# Patient Record
Sex: Female | Born: 1980 | Race: White | Hispanic: Yes | Marital: Single | Smoking: Never smoker
Health system: Southern US, Community
[De-identification: ages and names within clinical notes are randomized; demographics above are authoritative.]

## PROBLEM LIST (undated history)

## (undated) DIAGNOSIS — N939 Abnormal uterine and vaginal bleeding, unspecified: Secondary | ICD-10-CM

## (undated) DIAGNOSIS — I8390 Asymptomatic varicose veins of unspecified lower extremity: Secondary | ICD-10-CM

## (undated) DIAGNOSIS — M722 Plantar fascial fibromatosis: Secondary | ICD-10-CM

## (undated) DIAGNOSIS — E781 Pure hyperglyceridemia: Secondary | ICD-10-CM

## (undated) DIAGNOSIS — G43909 Migraine, unspecified, not intractable, without status migrainosus: Secondary | ICD-10-CM

## (undated) DIAGNOSIS — R87619 Unspecified abnormal cytological findings in specimens from cervix uteri: Secondary | ICD-10-CM

## (undated) DIAGNOSIS — M222X9 Patellofemoral disorders, unspecified knee: Secondary | ICD-10-CM

## (undated) DIAGNOSIS — D509 Iron deficiency anemia, unspecified: Secondary | ICD-10-CM

## (undated) DIAGNOSIS — E669 Obesity, unspecified: Secondary | ICD-10-CM

## (undated) DIAGNOSIS — M25569 Pain in unspecified knee: Secondary | ICD-10-CM

## (undated) HISTORY — DX: Unspecified abnormal cytological findings in specimens from cervix uteri: R87.619

## (undated) HISTORY — DX: Pain in unspecified knee: M25.569

## (undated) HISTORY — DX: Plantar fascial fibromatosis: M72.2

## (undated) HISTORY — DX: Migraine, unspecified, not intractable, without status migrainosus: G43.909

## (undated) HISTORY — DX: Obesity, unspecified: E66.9

## (undated) HISTORY — DX: Patellofemoral disorders, unspecified knee: M22.2X9

## (undated) HISTORY — DX: Pure hyperglyceridemia: E78.1

## (undated) HISTORY — DX: Iron deficiency anemia, unspecified: D50.9

## (undated) HISTORY — DX: Abnormal uterine and vaginal bleeding, unspecified: N93.9

## (undated) HISTORY — DX: Asymptomatic varicose veins of unspecified lower extremity: I83.90

---

## 2011-12-24 ENCOUNTER — Other Ambulatory Visit: Payer: Self-pay | Admitting: Geriatric Medicine

## 2011-12-24 DIAGNOSIS — R198 Other specified symptoms and signs involving the digestive system and abdomen: Secondary | ICD-10-CM

## 2011-12-24 DIAGNOSIS — R1011 Right upper quadrant pain: Secondary | ICD-10-CM

## 2011-12-29 ENCOUNTER — Other Ambulatory Visit: Payer: Self-pay

## 2013-11-16 HISTORY — PX: CHOLECYSTECTOMY: SHX55

## 2014-05-26 ENCOUNTER — Ambulatory Visit: Payer: Self-pay | Attending: Internal Medicine

## 2014-06-22 ENCOUNTER — Ambulatory Visit: Payer: Self-pay | Admitting: Internal Medicine

## 2014-07-25 ENCOUNTER — Ambulatory Visit: Payer: No Typology Code available for payment source | Attending: Family Medicine | Admitting: Family Medicine

## 2014-07-25 ENCOUNTER — Encounter: Payer: Self-pay | Admitting: Family Medicine

## 2014-07-25 VITALS — BP 90/63 | HR 88 | Temp 98.6°F | Resp 16 | Ht 60.5 in | Wt 188.0 lb

## 2014-07-25 DIAGNOSIS — K219 Gastro-esophageal reflux disease without esophagitis: Secondary | ICD-10-CM | POA: Insufficient documentation

## 2014-07-25 DIAGNOSIS — M549 Dorsalgia, unspecified: Secondary | ICD-10-CM

## 2014-07-25 DIAGNOSIS — R51 Headache: Secondary | ICD-10-CM | POA: Insufficient documentation

## 2014-07-25 DIAGNOSIS — Z9049 Acquired absence of other specified parts of digestive tract: Secondary | ICD-10-CM | POA: Insufficient documentation

## 2014-07-25 DIAGNOSIS — M5489 Other dorsalgia: Secondary | ICD-10-CM | POA: Insufficient documentation

## 2014-07-25 DIAGNOSIS — K2100 Gastro-esophageal reflux disease with esophagitis, without bleeding: Secondary | ICD-10-CM

## 2014-07-25 DIAGNOSIS — R739 Hyperglycemia, unspecified: Secondary | ICD-10-CM

## 2014-07-25 DIAGNOSIS — H538 Other visual disturbances: Secondary | ICD-10-CM | POA: Insufficient documentation

## 2014-07-25 DIAGNOSIS — Z833 Family history of diabetes mellitus: Secondary | ICD-10-CM | POA: Insufficient documentation

## 2014-07-25 DIAGNOSIS — R631 Polydipsia: Secondary | ICD-10-CM | POA: Insufficient documentation

## 2014-07-25 DIAGNOSIS — R350 Frequency of micturition: Secondary | ICD-10-CM | POA: Insufficient documentation

## 2014-07-25 DIAGNOSIS — M546 Pain in thoracic spine: Secondary | ICD-10-CM

## 2014-07-25 LAB — COMPLETE METABOLIC PANEL WITH GFR
ALT: 25 U/L (ref 0–35)
AST: 22 U/L (ref 0–37)
Albumin: 4 g/dL (ref 3.5–5.2)
Alkaline Phosphatase: 126 U/L — ABNORMAL HIGH (ref 39–117)
BILIRUBIN TOTAL: 0.2 mg/dL (ref 0.2–1.2)
BUN: 11 mg/dL (ref 6–23)
CHLORIDE: 102 meq/L (ref 96–112)
CO2: 27 mEq/L (ref 19–32)
CREATININE: 0.6 mg/dL (ref 0.50–1.10)
Calcium: 9.1 mg/dL (ref 8.4–10.5)
GFR, Est African American: >89 mL/min
GFR, Est Non African American: >89 mL/min
Glucose, Bld: 86 mg/dL (ref 70–99)
Potassium: 4.6 mEq/L (ref 3.5–5.3)
SODIUM: 137 meq/L (ref 135–145)
TOTAL PROTEIN: 7.7 g/dL (ref 6.0–8.3)

## 2014-07-25 LAB — POCT URINALYSIS DIPSTICK
BILIRUBIN UA: NEGATIVE
GLUCOSE UA: NEGATIVE
Ketones, UA: NEGATIVE
LEUKOCYTES UA: NEGATIVE
NITRITE UA: NEGATIVE
Protein, UA: NEGATIVE
Spec Grav, UA: 1.02
UROBILINOGEN UA: 0.2
pH, UA: 5.5

## 2014-07-25 LAB — CBC
HCT: 34.9 % — ABNORMAL LOW (ref 36.0–46.0)
HEMOGLOBIN: 11.5 g/dL — AB (ref 12.0–15.0)
MCH: 27.1 pg (ref 26.0–34.0)
MCHC: 33 g/dL (ref 30.0–36.0)
MCV: 82.1 fL (ref 78.0–100.0)
MPV: 10 fL (ref 9.4–12.4)
PLATELETS: 466 10*3/uL — AB (ref 150–400)
RBC: 4.25 MIL/uL (ref 3.87–5.11)
RDW: 14.6 % (ref 11.5–15.5)
WBC: 8.1 10*3/uL (ref 4.0–10.5)

## 2014-07-25 LAB — GLUCOSE, POCT (MANUAL RESULT ENTRY): POC GLUCOSE: 92 mg/dL (ref 70–99)

## 2014-07-25 LAB — POCT GLYCOSYLATED HEMOGLOBIN (HGB A1C): Hemoglobin A1C: 5.6

## 2014-07-25 MED ORDER — PANTOPRAZOLE SODIUM 40 MG PO TBEC: 40.0000 mg | DELAYED_RELEASE_TABLET | Freq: Every day | ORAL | Status: DC

## 2014-07-25 NOTE — Assessment & Plan Note (Addendum)
A: suspect MSK pain due to weakness and lack of physical activity P: CXR Tylenol prn Exercise Stretches Stress relief

## 2014-07-25 NOTE — Assessment & Plan Note (Signed)
A; GERD with esophagitis suspected P: CBC protonix  GERD diet  GI referral for EGD if symptoms do not respond to the above measures

## 2014-07-25 NOTE — Assessment & Plan Note (Signed)
Patient does not have diabetes Plan for low carb diet, 10 % weight loss, regular exercise to avoid diabetes

## 2014-07-25 NOTE — Progress Notes (Signed)
   Subjective:    Patient ID: Audrey SellWendy Epstein, female    DOB: October 22, 1980, 33 y.o.   MRN: [id number] CC: ? Diabetes, epigastric pain and upper back pain HPI 33 yo hispanic female History obtained via interpreter  1. ? Diabetes: gets blurry vision, HA, thirst, frequent urination. Concerned about diabetes given family history of diabetes. Reports history of prediabetes. No history of gestational diabetes. No exercise. Has a poor diet.   2. R upper Back pains:  X 6 months. Comes and goes. Achy and sharp. No SOB. associated with R upper chest pains.   3. R chest pains: come and go. Associated with GERD symptoms of bitter tasted in mouth, heartburn, coughing up blood streaked sputum in AM. No weight loss.    Soc hx: chronic non smoker Surg Hx: sp cholecystectomy 11/2013  Fam Hx: DM2 in mother and father  Review of Systems As per HPI    Objective:   Physical Exam BP 90/63 mmHg  Pulse 88  Temp(Src) 98.6 F (37 C)  Resp 16  Ht 5' 0.5" (1.537 m)  Wt 188 lb (85.276 kg)  BMI 36.10 kg/m2  SpO2 99%  LMP 07/12/2014 General appearance: alert, cooperative and no distress  Throat: swollen tonsils no erythema Back: symmetric, no curvature. ROM normal. No CVA tenderness. TTP upper back on R scapula Lungs: clear to auscultation bilaterally Heart: regular rate and rhythm, S1, S2 normal, no murmur, click, rub or gallop Abdomen: soft, non-tender; bowel sounds normal; no masses,  no organomegaly  Lab Results  Component Value Date   HGBA1C 5.6 07/25/2014        Assessment & Plan:

## 2014-07-25 NOTE — Patient Instructions (Signed)
Ms. Audrey Black,  Thank you for coming in today. It was a pleasure meeting you. I look forward to being your primary doctor.   1. Gerd: start protonix.   You will be called with lab results.  F/u in 4-6 weeks for physical   Dr. Armen PickupFunches   Opciones de alimentos para pacientes con reflujo gastroesofgico (Food Choices for Gastroesophageal Reflux Disease) Cuando se tiene reflujo gastroesofgico (ERGE), los alimentos que se ingieren y los hbitos de alimentacin son Engineer, productionmuy importantes. Elegir los alimentos adecuados puede ayudar a Paramedicaliviar las molestias ocasionadas por el ColumbiaERGE. QU PAUTAS GENERALES DEBO SEGUIR?  Elija las frutas, los vegetales, los cereales integrales, los productos lcteos, la carne de [place] Valleyvaca, de pescado y de ave con bajo contenido de grasas.  Limite las grasas, [address]como los Berkeleyaceites, los aderezos para [place]if[place]ensalada, la Echomanteca, los frutos secos y Programme researcher, broadcasting/film/videoel aguacate.  Lleve un registro de las comidas para identificar los alimentos que ocasionan sntomas.  Evite los alimentos que le ocasionen reflujo. Pueden ser distintos para cada persona.  Haga comidas pequeas con frecuencia en lugar de tres comidas OfficeMax Incorporatedabundantes todos los das.  Coma lentamente, en un clima distendido.  Limite el consumo de alimentos fritos.  Cocine los alimentos utilizando mtodos que no sean la fritura.  Evite el consumo alcohol.  Evite beber grandes cantidades de lquidos con las comidas.  Evite agacharse o recostarse hasta despus de 2 o 3horas de haber comido. QU ALIMENTOS NO SE RECOMIENDAN? Los siguientes son algunos alimentos y bebidas que pueden empeorar los sntomas: Veterinary surgeonVegetales Tomates. Jugo de tomate. Salsa de tomate y espagueti. Ajes. [place]ebolla y [place] Islandajo. Rbano picante. Frutas Naranjas, pomelos y limn (fruta y Sloveniajugo). Carnes Carnes de Williamsportvaca, de pescado y de ave con gran contenido de grasas. Esto incluye los perros calientes, las Abramscostillas, el [place]amjamn, la salchicha, el salame y el tocino. Lcteos Leche entera y  Doylestownleche chocolatada. PPG IndustriesCrema cida. Crema. Mantequilla. Helados. Queso crema.  Bebidas Caf y t negro, con o sin cafena Bebidas gaseosas o energizantes. Condimentos Salsa picante. Salsa barbacoa.  Dulces/postres Chocolate y cacao. Rosquillas. Menta y mentol. Grasas y Massachusetts Mutual Lifeaceites Alimentos con alto contenido de grasas, incluidas las papas fritas. Otros Vinagre. Especias picantes, como la Brink's Companypimienta negra, la pimienta blanca, la pimienta roja, la pimienta de cayena, el curry en McGuire AFBpolvo, los clavos de [place]olor, el jengibre y el [place] en polvo. Los artculos mencionados arriba pueden no ser Raytheonuna lista completa de las bebidas y los alimentos que se Theatre stage managerdeben evitar. Comunquese con el nutricionista para recibir ms informacin. Document Released: 05/14/2005 Document Revised: 08/09/2013 [facility] Patient Information 2015 PeotoneExitCare, MarylandLLC. This information is not intended to replace advice given to you by your health care provider. Make sure you discuss any questions you have with your health care provider.

## 2014-07-25 NOTE — Progress Notes (Signed)
Establish Care Complaining Blurry vision, HA, thirsty, frequency urinating Hx Pre Diabetic

## 2014-07-26 ENCOUNTER — Telehealth: Payer: Self-pay | Admitting: Family Medicine

## 2014-07-26 ENCOUNTER — Telehealth: Payer: Self-pay | Admitting: *Deleted

## 2014-07-26 LAB — MICROALBUMIN / CREATININE URINE RATIO
CREATININE, URINE: 154.2 mg/dL
MICROALB/CREAT RATIO: 3.2 mg/g (ref 0.0–30.0)
Microalb, Ur: 0.5 mg/dL (ref <2.0)

## 2014-07-26 NOTE — Telephone Encounter (Signed)
Pt came in person due to missed call.  Has to go to work at 2pm but will be looking out for call. Please f/u with pt.

## 2014-07-26 NOTE — Telephone Encounter (Signed)
Left voice message to return call 

## 2014-07-26 NOTE — Telephone Encounter (Signed)
Pt. Returning nurses call, please f/u with pt.  °

## 2014-07-26 NOTE — Telephone Encounter (Signed)
-----   Message from Lora PaulaJosalyn C Funches, MD sent at 07/25/2014  7:48 PM EST ----- Francena HanlyStella,  Please call patient and let her know that I would like for her to have a chest -xray done bc of her pains on the R side.

## 2014-07-27 ENCOUNTER — Ambulatory Visit (HOSPITAL_COMMUNITY)
Admission: RE | Admit: 2014-07-27 | Discharge: 2014-07-27 | Disposition: A | Discharge status: Home / Self Care | Payer: No Typology Code available for payment source | Source: Ambulatory Visit | Attending: Family Medicine | Admitting: Family Medicine

## 2014-07-27 DIAGNOSIS — M549 Dorsalgia, unspecified: Secondary | ICD-10-CM

## 2014-07-27 DIAGNOSIS — R079 Chest pain, unspecified: Secondary | ICD-10-CM | POA: Insufficient documentation

## 2014-07-27 DIAGNOSIS — M546 Pain in thoracic spine: Secondary | ICD-10-CM | POA: Insufficient documentation

## 2014-07-27 NOTE — Telephone Encounter (Signed)
Pt aware of lab results, notified need Xray done

## 2014-09-14 ENCOUNTER — Ambulatory Visit: Payer: No Typology Code available for payment source | Attending: Family Medicine | Admitting: Family Medicine

## 2014-09-14 ENCOUNTER — Encounter: Payer: Self-pay | Admitting: Family Medicine

## 2014-09-14 ENCOUNTER — Other Ambulatory Visit (HOSPITAL_COMMUNITY)
Admission: RE | Admit: 2014-09-14 | Discharge: 2014-09-14 | Disposition: A | Discharge status: Home / Self Care | Payer: No Typology Code available for payment source | Source: Ambulatory Visit | Attending: Family Medicine | Admitting: Family Medicine

## 2014-09-14 VITALS — BP 113/74 | HR 81 | Temp 98.0°F | Resp 16 | Ht 62.0 in | Wt 187.0 lb

## 2014-09-14 DIAGNOSIS — N760 Acute vaginitis: Secondary | ICD-10-CM

## 2014-09-14 DIAGNOSIS — Z833 Family history of diabetes mellitus: Secondary | ICD-10-CM

## 2014-09-14 DIAGNOSIS — M549 Dorsalgia, unspecified: Secondary | ICD-10-CM

## 2014-09-14 DIAGNOSIS — Z113 Encounter for screening for infections with a predominantly sexual mode of transmission: Secondary | ICD-10-CM | POA: Insufficient documentation

## 2014-09-14 DIAGNOSIS — B9689 Other specified bacterial agents as the cause of diseases classified elsewhere: Secondary | ICD-10-CM

## 2014-09-14 DIAGNOSIS — Z01419 Encounter for gynecological examination (general) (routine) without abnormal findings: Secondary | ICD-10-CM | POA: Insufficient documentation

## 2014-09-14 DIAGNOSIS — M546 Pain in thoracic spine: Secondary | ICD-10-CM

## 2014-09-14 DIAGNOSIS — A499 Bacterial infection, unspecified: Secondary | ICD-10-CM

## 2014-09-14 DIAGNOSIS — R109 Unspecified abdominal pain: Secondary | ICD-10-CM | POA: Insufficient documentation

## 2014-09-14 DIAGNOSIS — R1030 Lower abdominal pain, unspecified: Secondary | ICD-10-CM

## 2014-09-14 DIAGNOSIS — Z124 Encounter for screening for malignant neoplasm of cervix: Secondary | ICD-10-CM | POA: Insufficient documentation

## 2014-09-14 DIAGNOSIS — Z1151 Encounter for screening for human papillomavirus (HPV): Secondary | ICD-10-CM | POA: Insufficient documentation

## 2014-09-14 LAB — GLUCOSE, POCT (MANUAL RESULT ENTRY): POC Glucose: 91 mg/dl (ref 70–99)

## 2014-09-14 LAB — POCT URINALYSIS DIPSTICK
Bilirubin, UA: NEGATIVE
Glucose, UA: NEGATIVE
Ketones, UA: NEGATIVE
LEUKOCYTES UA: NEGATIVE
NITRITE UA: NEGATIVE
Spec Grav, UA: 1.025
Urobilinogen, UA: 0.2
pH, UA: 6.5

## 2014-09-14 LAB — POCT URINE PREGNANCY: Preg Test, Ur: NEGATIVE

## 2014-09-14 MED ORDER — NAPROXEN 500 MG PO TABS: 500.0000 mg | ORAL_TABLET | Freq: Two times a day (BID) | ORAL | Status: DC | PRN

## 2014-09-14 NOTE — Assessment & Plan Note (Signed)
Patient with normal blood sugar

## 2014-09-14 NOTE — Assessment & Plan Note (Signed)
GC/chlam, wet prep and HIV

## 2014-09-14 NOTE — Progress Notes (Signed)
Pt comes in today for Pap smear/STD screening States last pap normal LMP- 08/14/14 C/o lower abdominal pain radiating to back x 12 week intermit Denies fever,chills or vomiting

## 2014-09-14 NOTE — Assessment & Plan Note (Signed)
Resolved

## 2014-09-14 NOTE — Patient Instructions (Addendum)
Audrey Black,  Thank you for coming in today.  1. Abdominal pain: Premenstrual cramps vs small non-obstructive kidney stone. Pain has improved.  Plan:  If pain returns Strain urine with strainer if you collect stones put them in a baggy and bring them in. Drink plenty of water Take antiinflammatory If pain is severe or you develop nausea, vomiting or fever call and come in immediately.   You will be called with pap smear and lab results.   F/u in 3 months for abdominal pain, sooner if needed.   Dr. Armen PickupFunches

## 2014-09-14 NOTE — Assessment & Plan Note (Signed)
1. Abdominal pain: Premenstrual cramps vs small non-obstructive kidney stone. Pain has improved.  Plan:  If pain returns Strain urine with strainer if you collect stones put them in a baggy and bring them in. Drink plenty of water Take antiinflammatory If pain is severe or you develop nausea, vomiting or fever call and come in immediately.

## 2014-09-14 NOTE — Progress Notes (Signed)
   Subjective:    Patient ID: Audrey Black, female    DOB: 09/01/1980, 34 y.o.   MRN: [id number] CC: screening pap, abdominal pain  HPI Spanish interpreter present  1. Abdominal pain: x had last week for 3 days. Midline lower abdominal pain.  Associated with scant vaginal bleeding but it was not her period. She is having a bit of pain now. LMP 08/14/14. No associated N/V/fever. Some vaginal discharge irregular. Still having the vaginal discharge. No currently sexually active. Last time was one month ago. No condoms. No dysuria. No hematuria.  2. HM: due for pap. Also request STD testing.  Soc Hx: non smoker  Review of Systems As per HPI     Objective:   Physical Exam BP 113/74 mmHg  Pulse 81  Temp(Src) 98 F (36.7 C) (Oral)  Resp 16  Ht 5\' 2"  (1.575 m)  Wt 187 lb (84.823 kg)  BMI 34.19 kg/m2  SpO2 100%  LMP 08/14/2014 General appearance: alert, cooperative and no distress Lungs: clear to auscultation bilaterally Heart: regular rate and rhythm, S1, S2 normal, no murmur, click, rub or gallop  Back: mild L CVA tenderness, none on R Abdomen: soft, mild suprapubic tenderness; bowel sounds normal; no masses,  no organomegaly Pelvic: cervix normal in appearance, external genitalia normal, no adnexal masses or tenderness, positive findings: mild CMT with negative chandelier sign, uterus normal size, shape, and consistency and vagina normal without discharge  UA: trace blood only. U preg; negative     Assessment & Plan:

## 2014-09-15 LAB — CERVICOVAGINAL ANCILLARY ONLY
CHLAMYDIA, DNA PROBE: NEGATIVE
NEISSERIA GONORRHEA: NEGATIVE
Wet Prep (BD Affirm): NEGATIVE
Wet Prep (BD Affirm): NEGATIVE
Wet Prep (BD Affirm): POSITIVE — AB

## 2014-09-15 LAB — HIV ANTIBODY (ROUTINE TESTING W REFLEX): HIV 1&2 Ab, 4th Generation: NONREACTIVE

## 2014-09-18 ENCOUNTER — Telehealth: Payer: Self-pay | Admitting: *Deleted

## 2014-09-18 DIAGNOSIS — N760 Acute vaginitis: Secondary | ICD-10-CM

## 2014-09-18 DIAGNOSIS — B9689 Other specified bacterial agents as the cause of diseases classified elsewhere: Secondary | ICD-10-CM | POA: Insufficient documentation

## 2014-09-18 HISTORY — DX: Other specified bacterial agents as the cause of diseases classified elsewhere: B96.89

## 2014-09-18 LAB — CYTOLOGY - PAP

## 2014-09-18 MED ORDER — METRONIDAZOLE 500 MG PO TABS: 500.0000 mg | ORAL_TABLET | Freq: Two times a day (BID) | ORAL | Status: DC

## 2014-09-18 MED ORDER — FLUCONAZOLE 150 MG PO TABS: 150.0000 mg | ORAL_TABLET | Freq: Once | ORAL | Status: DC

## 2014-09-18 NOTE — Assessment & Plan Note (Signed)
A: BV on wet prep P: treat with flagyl and diflucan

## 2014-09-18 NOTE — Telephone Encounter (Signed)
Pt aware of Lab results (Results were given in Spanish)

## 2014-09-18 NOTE — Telephone Encounter (Signed)
-----   Message from Lora PaulaJosalyn C Funches, MD sent at 09/18/2014  9:08 AM EST ----- Negative Gc/chlam. BV only on wet prep will treat with flagyl and diflucan

## 2014-09-18 NOTE — Addendum Note (Signed)
Addended by: Dessa PhiFUNCHES, Jazzelle Zhang on: 09/18/2014 09:10 AM   Modules accepted: Orders

## 2014-09-19 ENCOUNTER — Telehealth: Payer: Self-pay | Admitting: *Deleted

## 2014-09-19 NOTE — Telephone Encounter (Signed)
-----   Message from Lora PaulaJosalyn C Funches, MD sent at 09/19/2014  3:13 PM EST ----- Negative pap. Repeat in 3 years

## 2014-09-19 NOTE — Telephone Encounter (Signed)
Pt aware of pap smear results (results was given in Spanish)

## 2016-01-07 ENCOUNTER — Ambulatory Visit: Payer: Self-pay

## 2017-08-24 ENCOUNTER — Encounter (HOSPITAL_COMMUNITY): Payer: Self-pay | Admitting: Emergency Medicine

## 2017-08-24 DIAGNOSIS — N760 Acute vaginitis: Secondary | ICD-10-CM | POA: Insufficient documentation

## 2017-08-24 LAB — CBC
HCT: 40.5 % (ref 36.0–46.0)
Hemoglobin: 13.2 g/dL (ref 12.0–15.0)
MCH: 29.3 pg (ref 26.0–34.0)
MCHC: 32.6 g/dL (ref 30.0–36.0)
MCV: 89.8 fL (ref 78.0–100.0)
PLATELETS: 378 10*3/uL (ref 150–400)
RBC: 4.51 MIL/uL (ref 3.87–5.11)
RDW: 13.6 % (ref 11.5–15.5)
WBC: 12.9 10*3/uL — ABNORMAL HIGH (ref 4.0–10.5)

## 2017-08-24 LAB — URINALYSIS, ROUTINE W REFLEX MICROSCOPIC
BILIRUBIN URINE: NEGATIVE
GLUCOSE, UA: NEGATIVE mg/dL
HGB URINE DIPSTICK: NEGATIVE
KETONES UR: NEGATIVE mg/dL
Leukocytes, UA: NEGATIVE
Nitrite: NEGATIVE
Protein, ur: NEGATIVE mg/dL
Specific Gravity, Urine: 1.02 (ref 1.005–1.030)
pH: 6 (ref 5.0–8.0)

## 2017-08-24 LAB — COMPREHENSIVE METABOLIC PANEL
ALK PHOS: 99 U/L (ref 38–126)
ALT: 24 U/L (ref 14–54)
AST: 27 U/L (ref 15–41)
Albumin: 4 g/dL (ref 3.5–5.0)
Anion gap: 8 (ref 5–15)
BUN: 11 mg/dL (ref 6–20)
CHLORIDE: 105 mmol/L (ref 101–111)
CO2: 24 mmol/L (ref 22–32)
CREATININE: 0.69 mg/dL (ref 0.44–1.00)
Calcium: 9.2 mg/dL (ref 8.9–10.3)
GFR calc Af Amer: >60 mL/min (ref >60)
GFR calc non Af Amer: >60 mL/min (ref >60)
Glucose, Bld: 92 mg/dL (ref 65–99)
Potassium: 3.9 mmol/L (ref 3.5–5.1)
SODIUM: 137 mmol/L (ref 135–145)
Total Bilirubin: 0.7 mg/dL (ref 0.3–1.2)
Total Protein: 7.8 g/dL (ref 6.5–8.1)

## 2017-08-24 LAB — LIPASE, BLOOD: LIPASE: 36 U/L (ref 11–51)

## 2017-08-24 LAB — I-STAT BETA HCG BLOOD, ED (MC, WL, AP ONLY): I-stat hCG, quantitative: <5 m[IU]/mL (ref <5)

## 2017-08-24 NOTE — ED Triage Notes (Signed)
Patient reports intermittent right lower abdomen for 3 days with nausea , denies emesis or diarrhea , no fever or chills . She was seen at an urgent care today sent here for further evaluation .

## 2017-08-25 ENCOUNTER — Emergency Department (HOSPITAL_COMMUNITY)
Admission: EM | Admit: 2017-08-25 | Discharge: 2017-08-25 | Disposition: A | Discharge status: Home / Self Care | Payer: Self-pay | Attending: Emergency Medicine | Admitting: Emergency Medicine

## 2017-08-25 ENCOUNTER — Emergency Department (HOSPITAL_COMMUNITY): Payer: Self-pay

## 2017-08-25 DIAGNOSIS — N898 Other specified noninflammatory disorders of vagina: Secondary | ICD-10-CM

## 2017-08-25 DIAGNOSIS — R1030 Lower abdominal pain, unspecified: Secondary | ICD-10-CM

## 2017-08-25 DIAGNOSIS — B9689 Other specified bacterial agents as the cause of diseases classified elsewhere: Secondary | ICD-10-CM

## 2017-08-25 DIAGNOSIS — N760 Acute vaginitis: Secondary | ICD-10-CM

## 2017-08-25 LAB — WET PREP, GENITAL
SPERM: NONE SEEN
Trich, Wet Prep: NONE SEEN
YEAST WET PREP: NONE SEEN

## 2017-08-25 LAB — GC/CHLAMYDIA PROBE AMP (~~LOC~~) NOT AT ARMC
Chlamydia: NEGATIVE
Neisseria Gonorrhea: NEGATIVE

## 2017-08-25 MED ORDER — AZITHROMYCIN 250 MG PO TABS
1000.0000 mg | ORAL_TABLET | Freq: Once | ORAL | Status: AC
  Administered 2017-08-25: 1000 mg via ORAL
  Filled 2017-08-25: qty 4

## 2017-08-25 MED ORDER — CEFTRIAXONE SODIUM 250 MG IJ SOLR
250.0000 mg | Freq: Once | INTRAMUSCULAR | Status: AC
  Administered 2017-08-25: 250 mg via INTRAMUSCULAR
  Filled 2017-08-25: qty 250

## 2017-08-25 MED ORDER — METRONIDAZOLE 500 MG PO TABS: 500.0000 mg | ORAL_TABLET | Freq: Two times a day (BID) | ORAL | 0 refills | Status: DC

## 2017-08-25 MED ORDER — LIDOCAINE HCL (PF) 1 % IJ SOLN
INTRAMUSCULAR | Status: AC
  Administered 2017-08-25: 2.1 mL
  Filled 2017-08-25: qty 5

## 2017-08-25 MED ORDER — ONDANSETRON HCL 4 MG/2ML IJ SOLN
4.0000 mg | Freq: Once | INTRAMUSCULAR | Status: AC
  Administered 2017-08-25: 4 mg via INTRAVENOUS
  Filled 2017-08-25: qty 2

## 2017-08-25 MED ORDER — IOPAMIDOL (ISOVUE-300) INJECTION 61%
100.0000 mL | Freq: Once | INTRAVENOUS | Status: AC | PRN
  Administered 2017-08-25: 100 mL via INTRAVENOUS

## 2017-08-25 MED ORDER — MORPHINE SULFATE (PF) 4 MG/ML IV SOLN
4.0000 mg | Freq: Once | INTRAVENOUS | Status: AC
  Administered 2017-08-25: 4 mg via INTRAVENOUS
  Filled 2017-08-25: qty 1

## 2017-08-25 MED ORDER — IOPAMIDOL (ISOVUE-300) INJECTION 61%
INTRAVENOUS | Status: AC
  Filled 2017-08-25: qty 100

## 2017-08-25 NOTE — ED Provider Notes (Signed)
[facility] EMERGENCY DEPARTMENT Provider Note   CSN: [id number] Arrival date & time: 08/24/17  1805     History   Chief Complaint Chief Complaint  Patient presents with  . Abdominal Pain    HPI Audrey Black is a 37 y.o. female.  Patient presents to the ED with a chief complaint of right lower abdominal pain.  She states that the pain started 3 days ago.  She states that it has progressively worsened.  She rates it as an 8/10 now.  She reports some associated nausea and vomiting.  She had 2 episodes of diarrhea several days ago.  She reports new vaginal discharge.  She also complains of intermittent dysuria. She has not taken anything to alleviate her symptoms.   The history is provided by the patient. A language interpreter was used.    History reviewed. No pertinent past medical history.  Patient Active Problem List   Diagnosis Date Noted  . BV (bacterial vaginosis) 09/18/2014  . Lower abdominal pain 09/14/2014  . Screening for STD (sexually transmitted disease) 09/14/2014  . Cervical cancer screening 09/14/2014  . GERD (gastroesophageal reflux disease) 07/25/2014  . Family history of diabetes mellitus (DM) 07/25/2014    Past Surgical History:  Procedure Laterality Date  . CHOLECYSTECTOMY  11/2013    OB History    No data available       Home Medications    Prior to Admission medications   Medication Sig Start Date End Date Taking? Authorizing Provider  fluconazole (DIFLUCAN) 150 MG tablet Take 1 tablet (150 mg total) by mouth once. 09/18/14   Funches, Gerilyn Nestle, MD  metroNIDAZOLE (FLAGYL) 500 MG tablet Take 1 tablet (500 mg total) by mouth 2 (two) times daily. 09/18/14   Funches, Gerilyn Nestle, MD  naproxen (NAPROSYN) 500 MG tablet Take 1 tablet (500 mg total) by mouth 2 (two) times daily as needed for moderate pain (with food). 09/14/14   Funches, Gerilyn Nestle, MD  pantoprazole (PROTONIX) 40 MG tablet Take 1 tablet (40 mg total) by mouth daily. Take 30 minutes  before supper Patient not taking: Reported on 09/14/2014 07/25/14   Dessa Phi, MD    Family History Family History  Problem Relation Age of Onset  . Diabetes Mother   . Diabetes Father   . Cancer Brother     Social History Social History   Tobacco Use  . Smoking status: Never Smoker  Substance Use Topics  . Alcohol use: No    Alcohol/week: 0.0 oz  . Drug use: No     Allergies   Patient has no known allergies.   Review of Systems Review of Systems  All other systems reviewed and are negative.    Physical Exam Updated Vital Signs BP 113/72 (BP Location: Right Arm)   Pulse 70   Temp 98.1 F (36.7 C) (Oral)   Resp 16   LMP 08/10/2017 (Exact Date)   SpO2 100%   Physical Exam  Constitutional: She is oriented to person, place, and time. She appears well-developed and well-nourished.  HENT:  Head: Normocephalic and atraumatic.  Eyes: Conjunctivae and EOM are normal. Pupils are equal, round, and reactive to light.  Neck: Normal range of motion. Neck supple.  Cardiovascular: Normal rate and regular rhythm. Exam reveals no gallop and no friction rub.  No murmur heard. Pulmonary/Chest: Effort normal and breath sounds normal. No respiratory distress. She has no wheezes. She has no rales. She exhibits no tenderness.  Abdominal: Soft. Bowel sounds are normal. She exhibits  no distension and no mass. There is tenderness in the right lower quadrant. There is no rebound and no guarding.  Musculoskeletal: Normal range of motion. She exhibits no edema or tenderness.  Neurological: She is alert and oriented to person, place, and time.  Skin: Skin is warm and dry.  Psychiatric: She has a normal mood and affect. Her behavior is normal. Judgment and thought content normal.  Nursing note and vitals reviewed.    ED Treatments / Results  Labs (all labs ordered are listed, but only abnormal results are displayed) Labs Reviewed  CBC - Abnormal; Notable for the following  components:      Result Value   WBC 12.9 (*)    All other components within normal limits  URINALYSIS, ROUTINE W REFLEX MICROSCOPIC - Abnormal; Notable for the following components:   APPearance HAZY (*)    All other components within normal limits  WET PREP, GENITAL  LIPASE, BLOOD  COMPREHENSIVE METABOLIC PANEL  I-STAT BETA HCG BLOOD, ED ([facility], [facility], [facility] ONLY)  GC/CHLAMYDIA PROBE AMP ([place]) NOT [facility]    EKG  EKG Interpretation None       Radiology No results found.  Procedures Procedures (including critical care time)  Medications Ordered in ED Medications  morphine 4 MG/ML injection 4 mg (not administered)  ondansetron (ZOFRAN) injection 4 mg (not administered)     Initial Impression / Assessment and Plan / ED Course  I have reviewed the triage vital signs and the nursing notes.  Pertinent labs & imaging results that were available during my care of the patient were reviewed by me and considered in my medical decision making (see chart for details).     Patient with RLQ pain.  She is somewhat tender to palpation.  She has a mild leukocytosis.  She is not pregnant.  There is concern for appy given location and tenderness, but she will also need a pelvic for new vaginal discharge.  4:43 AM CT negative.    Wet prep remarkable for clue cells.  Will cover for BV.  Will also treat with Azithro and rocephin.    PCP/OBGYN follow-up.  All questions answered.  Final Clinical Impressions(s) / ED Diagnoses   Final diagnoses:  BV (bacterial vaginosis)  Vaginal discharge  Lower abdominal pain    ED Discharge Orders        Ordered    metroNIDAZOLE (FLAGYL) 500 MG tablet  2 times daily     08/25/17 0442       Roxy HorsemanBrowning, Callaway Hailes, PA-C 08/25/17 0444    Palumbo, April, MD 08/25/17 [phone]

## 2017-08-25 NOTE — ED Notes (Signed)
Patient transported to CT 

## 2019-06-30 ENCOUNTER — Other Ambulatory Visit: Payer: Self-pay

## 2019-06-30 ENCOUNTER — Ambulatory Visit (HOSPITAL_COMMUNITY)
Admission: EM | Admit: 2019-06-30 | Discharge: 2019-06-30 | Disposition: A | Discharge status: Home / Self Care | Payer: Self-pay | Attending: Internal Medicine | Admitting: Internal Medicine

## 2019-06-30 ENCOUNTER — Encounter (HOSPITAL_COMMUNITY): Payer: Self-pay | Admitting: Emergency Medicine

## 2019-06-30 DIAGNOSIS — M542 Cervicalgia: Secondary | ICD-10-CM

## 2019-06-30 DIAGNOSIS — R519 Headache, unspecified: Secondary | ICD-10-CM

## 2019-06-30 DIAGNOSIS — M5412 Radiculopathy, cervical region: Secondary | ICD-10-CM

## 2019-06-30 DIAGNOSIS — R070 Pain in throat: Secondary | ICD-10-CM

## 2019-06-30 MED ORDER — CYCLOBENZAPRINE HCL 5 MG PO TABS: 5.0000 mg | ORAL_TABLET | Freq: Every day | ORAL | 0 refills | Status: DC

## 2019-06-30 MED ORDER — PREDNISONE 20 MG PO TABS: ORAL_TABLET | ORAL | 0 refills | Status: DC

## 2019-06-30 NOTE — ED Triage Notes (Addendum)
Pt was seen two weeks ago for pain in the back of her head.  She was given Amitriptyline and Imitrex for her head pain.  That same day, after leaving the doctor she began having pain in her left neck that radiates down the back of her shoulder and the back of her left arm.  She states the pain is throbbing and her arm will become numb.  Pt states that this has been happening intermittently over the last two weeks.  With today's pain she states she felt like someone was squeezing her throat and she was unable to breathe.  She started crying and also states that she got very hot and started sweating.

## 2019-06-30 NOTE — ED Provider Notes (Signed)
[facility]   MRN: [id number] DOB: 1981/01/25  Subjective:   Audrey Black is a 38 y.o. female presenting for 2-week history of persistent moderate to severe intermittent left-sided neck pain that radiates into her shoulder and all the way down to her hand.  She also feels intermittent tingling and numbing sensations in her left arm.  She has started to work with a PCP, had lab work that only showed a low vitamin D level last week.  Patient states that she was also given a medication for migraine headaches giving persistent headaches over her occiput.  She tried her first dose today of amitriptyline and Imitrex.  Thereafter, patient states that she had a sensation of throat closing, left leg weakness and numbness, a crying spell and recurrence of her left arm pain.  She states that the symptoms lasted several minutes and resolved on their own without any medications.  Denies fever, trauma, incontinence, chest pain, shortness of breath, wheezing, facial swelling.  Patient does not have any follow-up set up with her PCP.  She does admit that she was helping to get an MRI but this was not scheduled or discussed.  No current facility-administered medications for this encounter.   Current Outpatient Medications:  .  amitriptyline (ELAVIL) 10 MG tablet, Take 10 mg by mouth at bedtime., Disp: , Rfl:  .  SUMAtriptan (IMITREX) 50 MG tablet, Take 100 mg by mouth 2 (two) times daily as needed., Disp: , Rfl:  .  fluconazole (DIFLUCAN) 150 MG tablet, Take 1 tablet (150 mg total) by mouth once., Disp: 1 tablet, Rfl: 0 .  metroNIDAZOLE (FLAGYL) 500 MG tablet, Take 1 tablet (500 mg total) by mouth 2 (two) times daily., Disp: 14 tablet, Rfl: 0 .  naproxen (NAPROSYN) 500 MG tablet, Take 1 tablet (500 mg total) by mouth 2 (two) times daily as needed for moderate pain (with food)., Disp: 60 tablet, Rfl: 1 .  pantoprazole (PROTONIX) 40 MG tablet, Take 1 tablet (40 mg total) by mouth daily. Take 30 minutes before  supper (Patient not taking: Reported on 09/14/2014), Disp: 30 tablet, Rfl: 1   No Known Allergies  History reviewed. No pertinent past medical history.   Past Surgical History:  Procedure Laterality Date  . CHOLECYSTECTOMY  11/2013    Family History  Problem Relation Age of Onset  . Diabetes Mother   . Diabetes Father   . Cancer Brother    Reports that her father died of an MI at age of 63.  Brother has a history of stomach cancer.  Social History   Tobacco Use  . Smoking status: Never Smoker  Substance Use Topics  . Alcohol use: No    Alcohol/week: 0.0 standard drinks  . Drug use: No    ROS   Objective:   Vitals: BP 114/82 (BP Location: Right Arm)   Pulse 81   Temp 98.2 F (36.8 C) (Oral)   Resp 12   LMP 06/02/2019 (Approximate)   SpO2 98%   Physical Exam Constitutional:      General: She is not in acute distress.    Appearance: Normal appearance. She is well-developed. She is not ill-appearing, toxic-appearing or diaphoretic.  HENT:     Head: Normocephalic and atraumatic.     Nose: Nose normal.     Mouth/Throat:     Mouth: Mucous membranes are moist.     Pharynx: Oropharynx is clear. No oropharyngeal exudate or posterior oropharyngeal erythema.     Comments: Airway is patent.  No facial swelling noted. Eyes:     General:        Right eye: No discharge.        Left eye: No discharge.     Extraocular Movements: Extraocular movements intact.     Pupils: Pupils are equal, round, and reactive to light.  Neck:     Musculoskeletal: Normal range of motion and neck supple. No neck rigidity.     Vascular: No carotid bruit.  Cardiovascular:     Rate and Rhythm: Normal rate and regular rhythm.     Pulses: Normal pulses.     Heart sounds: Normal heart sounds. No murmur. No friction rub. No gallop.   Pulmonary:     Effort: Pulmonary effort is normal. No respiratory distress.     Breath sounds: Normal breath sounds. No stridor. No wheezing, rhonchi or rales.   Musculoskeletal:     Left shoulder: She exhibits normal range of motion, no tenderness, no bony tenderness, no swelling, no effusion, no crepitus, no deformity, no laceration, no pain, no spasm, normal pulse and normal strength.     Cervical back: She exhibits tenderness (Across area outlined) and spasm (Spasms over mid trapezius). She exhibits normal range of motion, no bony tenderness, no swelling, no edema and no deformity.       Back:     Comments: Negative Lhermitte and Spurling maneuver.  Lymphadenopathy:     Cervical: No cervical adenopathy.  Skin:    General: Skin is warm and dry.     Findings: No rash.  Neurological:     Mental Status: She is alert and oriented to person, place, and time.     Cranial Nerves: No cranial nerve deficit.     Motor: No weakness.     Coordination: Coordination normal.     Gait: Gait normal.     Deep Tendon Reflexes: Reflexes normal.  Psychiatric:        Behavior: Behavior normal.        Thought Content: Thought content normal.        Judgment: Judgment normal.      Assessment and Plan :   1. Cervical radiculopathy   2. Neck pain   3. Throat discomfort   4. Recurrent occipital headache     Patient has vague and multiple symptoms from different body systems.  Discussed possibility of anxiety and panic attacks.  Recommended patient follow-up with her PCP for this.  Discussed cervical radiculopathy and will try to address this with a steroid course, muscle relaxant.  Patient has not had any trauma, fall and therefore I deferred x-rays, patient was in agreement.  Recommended patient continue to pursue MRI with her PCP.  She may end up needing a cervical and cerebral MRI.  Patient verbalized understanding will have close follow-up with her PCP. Counseled patient on potential for adverse effects with medications prescribed/recommended today, ER and return-to-clinic precautions discussed, patient verbalized understanding.    Jaynee Eagles, [place]  06/30/19 1646

## 2019-07-03 IMAGING — CT CT ABD-PELV W/ CM
2 of 4 series · 16 of 46 positions shown, 18 images · IV contrast (APPLIED)
Comparison: None.

CLINICAL DATA: 36 y/o F; intermittent right lower abdominal pain
for 3 days nausea.

EXAM:
CT ABDOMEN AND PELVIS WITH CONTRAST
TECHNIQUE: Multidetector CT imaging of the abdomen and pelvis was performed
using the standard protocol following bolus administration of
intravenous contrast.
CONTRAST:  100mL WCFY2F-PJJ IOPAMIDOL (WCFY2F-PJJ) INJECTION 61%

[Series 3: abdomen 5.0 · axial · 0.61mm/px · z∈[+649,+1094]mm · 13 of 101 slices shown, 15 images]
[im 6/101  soft-tissue]
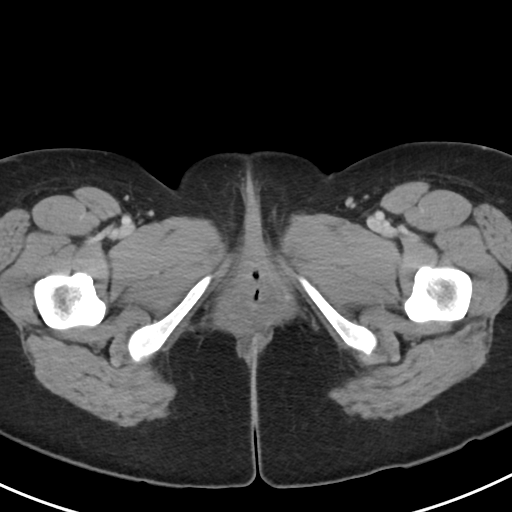
[im 6/101  bone]
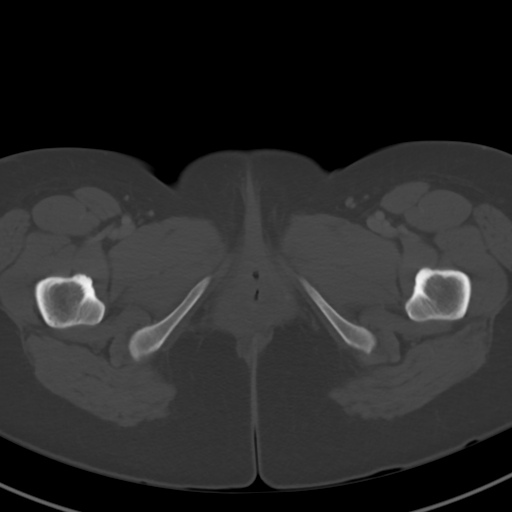
[im 12/101  soft-tissue]
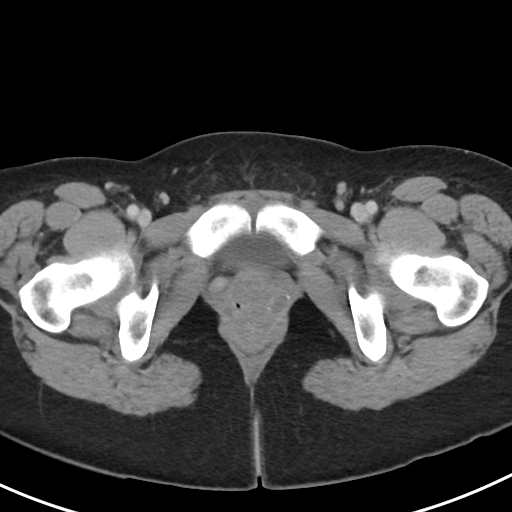
[im 23/101  soft-tissue]
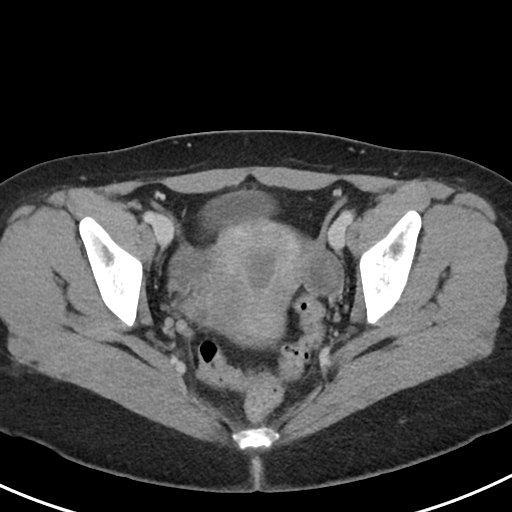
[im 28/101  soft-tissue]
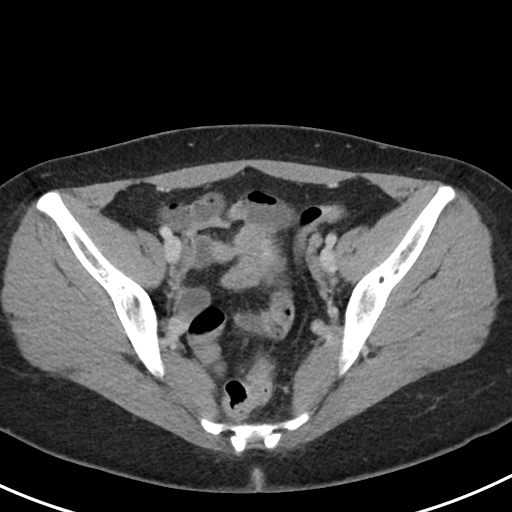
[im 34/101  soft-tissue]
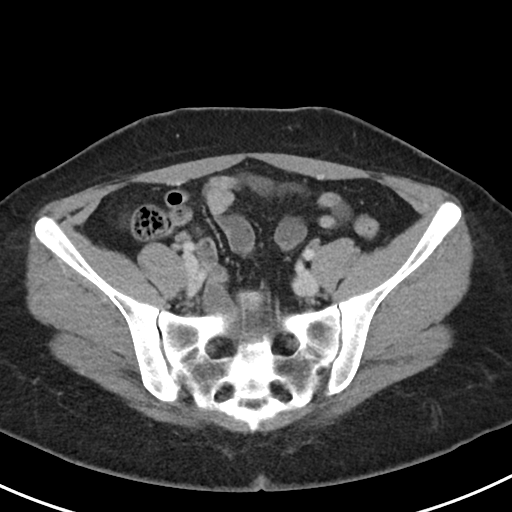
[im 45/101  soft-tissue]
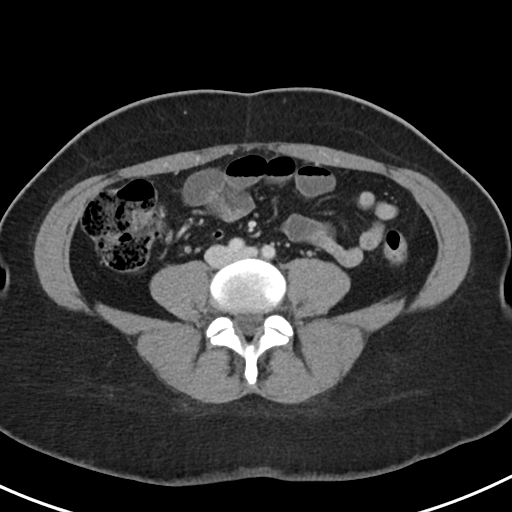
[im 51/101  soft-tissue]
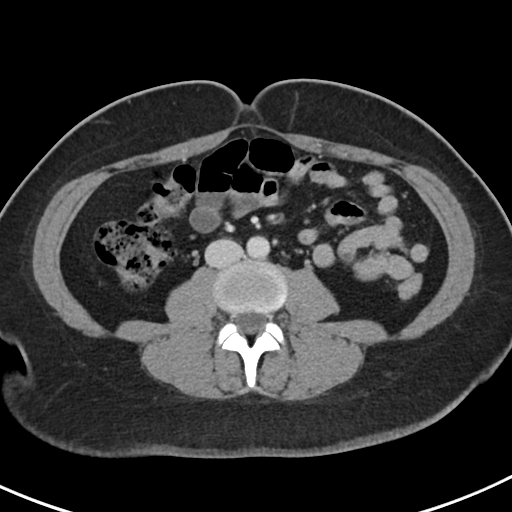
[im 56/101  soft-tissue]
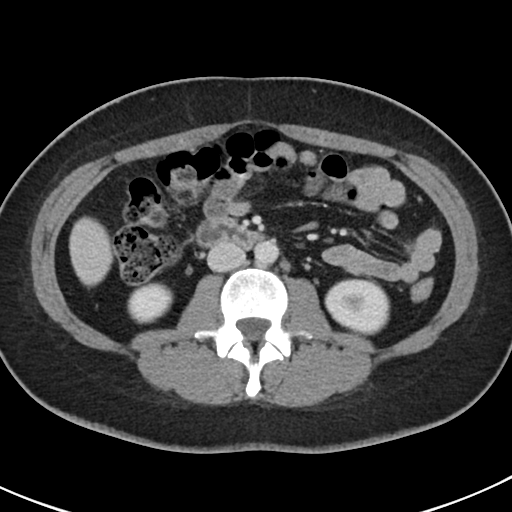
[im 67/101  soft-tissue]
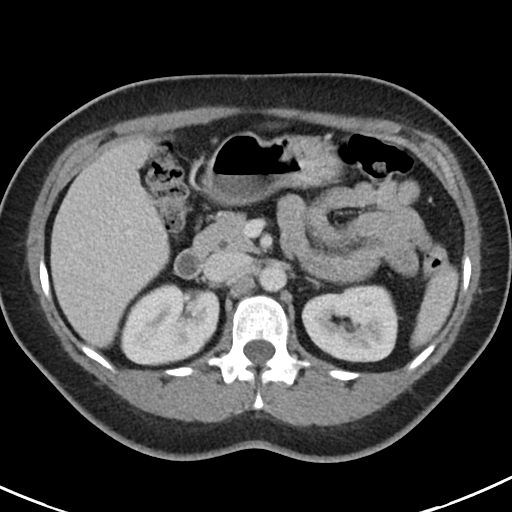
[im 67/101  bone]
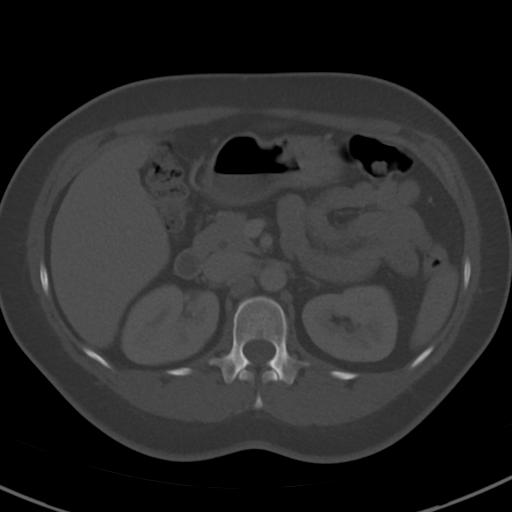
[im 73/101  soft-tissue]
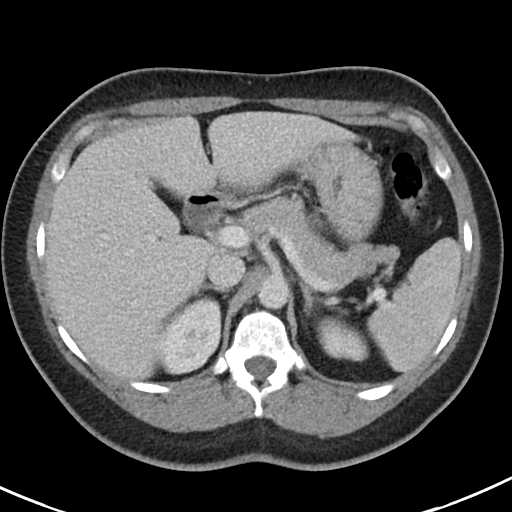
[im 78/101  soft-tissue]
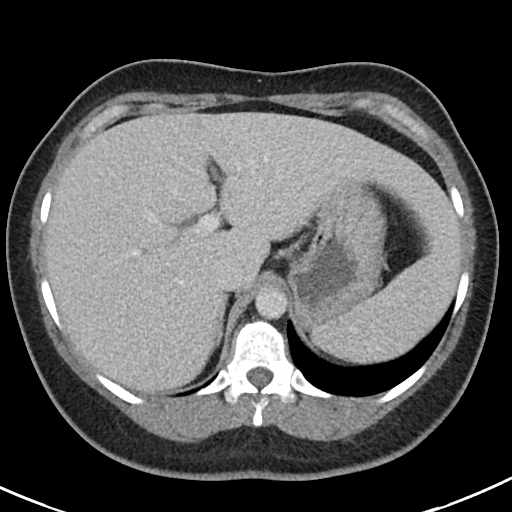
[im 89/101  soft-tissue]
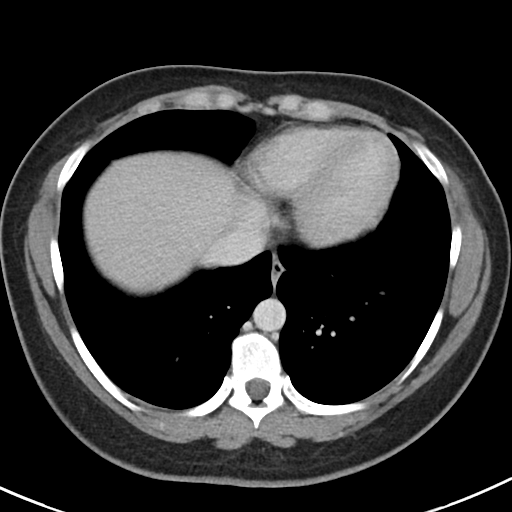
[im 95/101  soft-tissue]
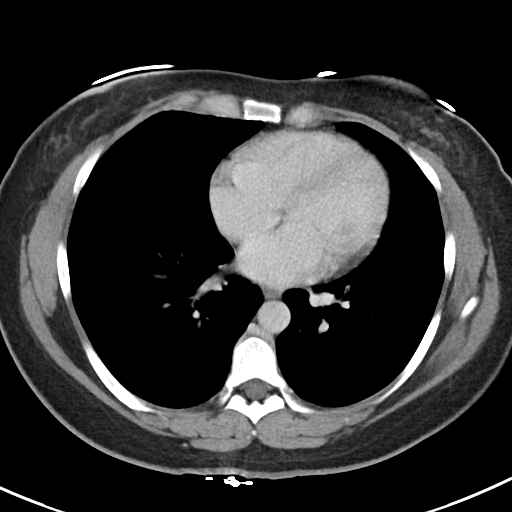

[Series 6: abdomen 3.0 mpr cor · coronal · 0.72mm/px · 3 of 94 slices shown]
[im 32/94  soft-tissue]
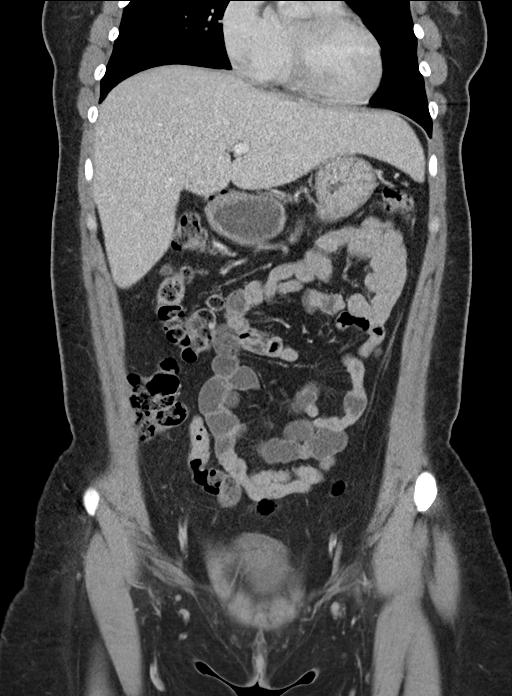
[im 42/94  soft-tissue]
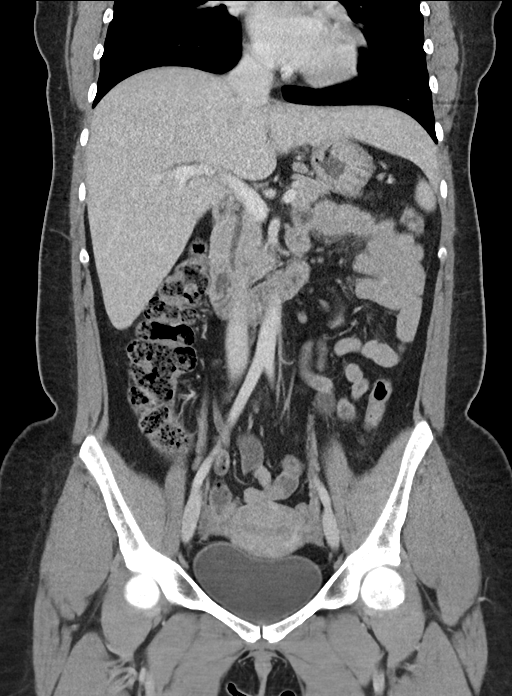
[im 52/94  soft-tissue]
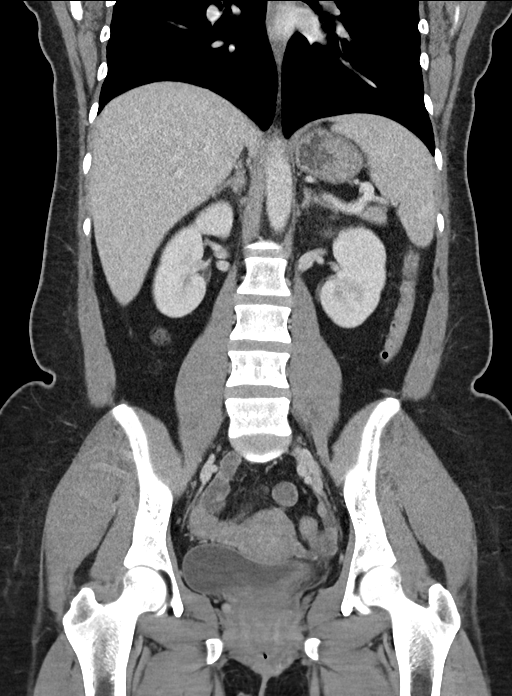

[16 of 46 positions shown; findings below may reference images not displayed]

FINDINGS: Lower chest: No acute abnormality.

Hepatobiliary: No focal liver abnormality is seen. No gallstones,
gallbladder wall thickening, or biliary dilatation.

Pancreas: Unremarkable. No pancreatic ductal dilatation or
surrounding inflammatory changes.

Spleen: Normal in size without focal abnormality.

Adrenals/Urinary Tract: Adrenal glands are unremarkable. Kidneys are
normal, without renal calculi, focal lesion, or hydronephrosis.
Bladder is unremarkable.

Stomach/Bowel: Stomach is within normal limits. Appendix appears
normal. No evidence of bowel wall thickening, distention, or
inflammatory changes.

Vascular/Lymphatic: No significant vascular findings are present. No
enlarged abdominal or pelvic lymph nodes.

Reproductive: Uterus and bilateral adnexa are unremarkable.

Other: No abdominal wall hernia or abnormality. No abdominopelvic
ascites.

Musculoskeletal: No acute or significant osseous findings.
IMPRESSION: No acute process identified. Normal appendix. Unremarkable CT of the
abdomen and pelvis.

By: Ivan Cuby Sanader M.D.

## 2020-01-23 ENCOUNTER — Encounter: Payer: Self-pay | Admitting: Gastroenterology

## 2020-03-09 ENCOUNTER — Ambulatory Visit: Payer: Self-pay | Admitting: Gastroenterology

## 2020-03-09 ENCOUNTER — Encounter: Payer: Self-pay | Admitting: Gastroenterology

## 2020-03-09 ENCOUNTER — Other Ambulatory Visit (INDEPENDENT_AMBULATORY_CARE_PROVIDER_SITE_OTHER): Payer: Self-pay

## 2020-03-09 VITALS — BP 102/68 | HR 78 | Ht 61.5 in | Wt 179.2 lb

## 2020-03-09 DIAGNOSIS — R12 Heartburn: Secondary | ICD-10-CM

## 2020-03-09 DIAGNOSIS — K5909 Other constipation: Secondary | ICD-10-CM

## 2020-03-09 DIAGNOSIS — R1010 Upper abdominal pain, unspecified: Secondary | ICD-10-CM

## 2020-03-09 DIAGNOSIS — D500 Iron deficiency anemia secondary to blood loss (chronic): Secondary | ICD-10-CM

## 2020-03-09 DIAGNOSIS — R195 Other fecal abnormalities: Secondary | ICD-10-CM

## 2020-03-09 LAB — CBC WITH DIFFERENTIAL/PLATELET
Basophils Absolute: 0 10*3/uL (ref 0.0–0.1)
Basophils Relative: 0.5 % (ref 0.0–3.0)
Eosinophils Absolute: 0.3 10*3/uL (ref 0.0–0.7)
Eosinophils Relative: 2.8 % (ref 0.0–5.0)
HCT: 34.9 % — ABNORMAL LOW (ref 36.0–46.0)
Hemoglobin: 11.4 g/dL — ABNORMAL LOW (ref 12.0–15.0)
Lymphocytes Relative: 31.9 % (ref 12.0–46.0)
Lymphs Abs: 3.1 10*3/uL (ref 0.7–4.0)
MCHC: 32.8 g/dL (ref 30.0–36.0)
MCV: 83.5 fl (ref 78.0–100.0)
Monocytes Absolute: 0.6 10*3/uL (ref 0.1–1.0)
Monocytes Relative: 6 % (ref 3.0–12.0)
Neutro Abs: 5.7 10*3/uL (ref 1.4–7.7)
Neutrophils Relative %: 58.8 % (ref 43.0–77.0)
Platelets: 356 10*3/uL (ref 150.0–400.0)
RBC: 4.18 Mil/uL (ref 3.87–5.11)
RDW: 20.1 % — ABNORMAL HIGH (ref 11.5–15.5)
WBC: 9.7 10*3/uL (ref 4.0–10.5)

## 2020-03-09 LAB — IBC + FERRITIN
Ferritin: 5.6 ng/mL — ABNORMAL LOW (ref 10.0–291.0)
Iron: 25 ug/dL — ABNORMAL LOW (ref 42–145)
Saturation Ratios: 4.7 % — ABNORMAL LOW (ref 20.0–50.0)
Transferrin: 383 mg/dL — ABNORMAL HIGH (ref 212.0–360.0)

## 2020-03-09 NOTE — Patient Instructions (Addendum)
If you are age 37 or older, your body mass index should be between 23-30. Your Body mass index is 33.32 kg/m. If this is out of the aforementioned range listed, please consider follow up with your Primary Care Provider.  If you are age 37 or younger, your body mass index should be between 19-25. Your Body mass index is 33.32 kg/m. If this is out of the aformentioned range listed, please consider follow up with your Primary Care Provider.   You have been scheduled for a EGD/colonoscopy. Please follow written instructions given to you at your visit today.  Please pick up your prep supplies at the pharmacy within the next 1-3 days. If you use inhalers (even only as needed), please bring them with you on the day of your procedure.  Your provider has requested that you go to the basement level for lab work before leaving today. Press "B" on the elevator. The lab is located at the first door on the left as you exit the elevator.  Due to recent changes in healthcare laws, you may see the results of your imaging and laboratory studies on MyChart before your provider has had a chance to review them.  We understand that in some cases there may be results that are confusing or concerning to you. Not all laboratory results come back in the same time frame and the provider may be waiting for multiple results in order to interpret others.  Please give us 48 hours in order for your provider to thoroughly review all the results before contacting the office for clarification of your results.   For occasional reflux:   no food 3-4 hours before bed Sleep in two pillows to elevate the head of bed Maalox or Gaviscon as needed if you have heartburn/acid reflux at night.  Miralax one-half to one capful in a glass of water daily.  It was a pleasure to see you today!  Dr. Myrtie Neitheranis   ______________________________________________  Tyron RussellEnfermedad de reflujo gastroesofgico en los adultos Gastroesophageal Reflux Disease,  Adult El reflujo gastroesofgico (RGE) ocurre cuando el cido del estmago sube por el tubo que conecta la boca con el estmago (esfago). Normalmente, la comida baja por el esfago y se mantiene en el [address], donde se la digiere. Cuando una persona tiene RGE, los alimentos y el cido estomacal suelen volver al esfago. Usted puede tener una enfermedad llamada enfermedad de reflujo gastroesofgico (ERGE) si el reflujo:  Sucede a menudo.  Causa sntomas frecuentes o muy intensos.  Causa problemas tales como dao en el esfago. Cuando esto ocurre, el esfago duele y se hincha (inflama). Con el tiempo, la ERGE puede ocasionar pequeos agujeros (lceras) en el revestimiento del esfago. Cules son las causas? Esta afeccin se debe a un problema en el msculo que se encuentra entre el esfago y [place] estmago. Cuando este msculo est dbil o no es normal, no se cierra correctamente para impedir que los alimentos y el cido regresen del Teaching laboratory technicianestmago. El msculo puede debilitarse debido a lo siguiente:  El consumo de [place] Man[place].  [place]ar[place].  Tener cierto tipo de hernia (hernia de hiato).  Consumo de alcohol.  Ciertos alimentos y bebidas, como caf, chocolate, cebollas y Cro[place]menta. Qu incrementa el riesgo? Es ms probable que tenga esta afeccin si:  Tiene sobrepeso.  Tiene una enfermedad que afecta el tejido conjuntivo.  [place] antiinflamatorios no esteroideos (AINE). Cules son los signos o los sntomas? Los sntomas de esta afeccin incluyen:  Acidez estomacal.  Dificultad o dolor al tragar.  Sensacin de Warehouse manager un bulto en la garganta.  Sabor amargo en la boca.  Mal aliento.  Tener una gran cantidad de saliva.  Estmago inflamado o con Dentist.  Eructos.  Dolor en el pecho. El dolor de pecho puede deberse a distintas afecciones. Asegrese de Science writer a su mdico si tiene Journalist, newspaper.  Falta de aire o respiracin ruidosa (sibilancias).  Tos constante (crnica) o durante la  noche.  Desgaste de la superficie de los dientes (esmalte dental).  Prdida de peso. Cmo se trata? El tratamiento depender de la gravedad de los sntomas. El mdico puede sugerirle lo siguiente:  Cambios en la dieta.  Medicamentos.  [place]. Siga estas indicaciones en su casa: Comida y bebida   Siga una dieta como se lo haya indicado el mdico. Es posible que deba evitar alimentos y bebidas, por ejemplo: ? Caf y t (con o sin cafena). ? Bebidas que contengan alcohol. ? Bebidas energticas y deportivas. ? Bebidas gaseosas y refrescos. ? Chocolate y cacao. ? Menta y esencia de [place]. ? Ajo y cebolla. ? Rbano picante. ? Alimentos cidos y condimentados. Estos incluyen todos los tipos de pimientos, [place] en polvo, curry en polvo, vinagre, salsas picantes y Occ[employer] Petroleum. ? Ctricos y sus jugos, por ejemplo, naranjas, limones y limas. ? Alimentos que [employer]. Estos incluyen salsa roja, [place], salsa picante y pizza con salsa de [place]. ? Alimentos fritos y Lexicographer. Estos incluyen donas, papas fritas, papitas fritas de bolsa y aderezos con alto contenido de [place] ? Carnes con alto contenido de [place] Estas incluye los perros calientes, chuletas o costillas, embutidos, jamn y tocino. ? Productos lcteos ricos en grasas, como leche [place], [place] y [place] crema.  Consuma pequeas cantidades de comida con ms frecuencia. Evite consumir porciones abundantes.  Evite beber grandes cantidades de lquidos con las comidas.  Evite comer 2 o 3horas antes de acostarse.  Evite recostarse inmediatamente despus de comer.  No haga ejercicios enseguida despus de comer. Estilo de vida   No consuma ningn producto que contenga nicotina o tabaco. Estos incluyen cigarrillos, cigarrillos electrnicos y tabaco para Theatre manager. Si necesita ayuda para dejar de fumar, consulte al American Express.  Intente reducir [employer]ney de estrs. Si necesita ayuda para hacer esto, consulte al  mdico.  Si tiene sobrepeso, baje una cantidad de peso saludable para usted. Consulte a su mdico para bajar de peso de [employer]. Indicaciones generales  Est atento a cualquier cambio en los sntomas.  Tome los medicamentos de venta libre y los recetados solamente como se lo haya indicado el mdico. No tome aspirina, ibuprofeno ni otros AINE a menos que el mdico lo autorice.  Use ropa holgada. No use nada apretado alrededor de la cintura.  Levante (eleve) la cabecera de la cama aproximadamente 6pulgadas (15cm).  Evite inclinarse si al hacerlo empeoran los sntomas.  Concurra a todas las visitas de [address] se lo haya indicado el mdico. Esto es importante. Comunquese con un mdico si:  Aparecen nuevos sntomas.  Adelgaza y no sabe por qu.  Tiene problemas para tragar o le duele cuando traga.  Tiene sibilancias o tos persistente.  Los sntomas no mejoran con Scientist, research (medical).  Tiene la voz ronca. Solicite ayuda inmediatamente si:  [employer], el cuello, la [place], los dientes o la espalda.  Se siente transpirado, mareado o tiene una sensacin de desvanecimiento.  Siente falta de aire o Journalist, newspaper.  Vomita y el vmito tiene un aspecto  similar a la sangre o a los posos de caf.  Pierde el conocimiento (se desmaya).  Las deposiciones (heces) son sanguinolentas o negras.  No puede tragar, beber o comer. Resumen  Si una persona tiene enfermedad de reflujo gastroesofgico (ERGE), los alimentos y el cido estomacal suben al esfago y causan sntomas o problemas tales como dao en el esfago.  El tratamiento depender de la gravedad de los sntomas.  Siga una Air traffic controller se lo haya indicado el mdico.  Tome todos los medicamentos solamente como se lo haya indicado el mdico. Esta informacin no tiene Theme park manager el consejo del mdico. Asegrese de hacerle al mdico cualquier pregunta que tenga. Document Revised: 03/18/2018  Document Reviewed: 03/18/2018 Elsevier Patient Education  2020 Elsevier Inc.  Opciones de alimentos para pacientes adultos con enfermedad de reflujo gastroesofgico Food Choices for Gastroesophageal Reflux Disease, Adult Si tiene enfermedad de reflujo gastroesofgico (ERGE), los alimentos que consume y los hbitos de alimentacin son Engineer, production. Elegir los alimentos adecuados puede ayudar a [employer]. Piense en consultar a un especialista en nutricin (nutricionista) para que lo ayude a Associate Professor. Consejos para seguir [employer] plan  Comidas  Elija alimentos saludables con bajo contenido de grasa, como frutas, verduras, cereales integrales, productos lcteos descremados y carne [place] de [place], de pescado y de [place].  Haga comidas pequeas durante Glass blower/designer de 3 comidas abundantes. Coma lentamente y en un lugar donde est distendido. Evite agacharse o recostarse hasta 2 o 3horas despus de haber comido.  Evite comer 2 a 3horas antes de ir a acostarse.  Evite beber grandes cantidades de lquidos con las comidas.  Evite frer los alimentos a la hora de la coccin. Puede hornear, grillar o asar a la parrilla.  Evite o limite la cantidad de: ? Chocolate. ? Menta y mentol. ? Alcohol. ? Pimienta. ? Caf negro y descafeinado. ? T negro y descafeinado. ? Bebidas con gas (gaseosas). ? Bebidas energizantes y refrescos que contengan cafena.  Limite los alimentos con alto contenido de [place], por ejemplo: ? Carnes grasas o alimentos fritos. ? Leche entera, crema, manteca o helado. ? Nueces y [place] de frutos secos. ? Pastelera, donas y dulces hechos con [place] o [place].  Evite los alimentos que le ocasionen sntomas. Estos pueden ser distintos para Advertising account planner. Los alimentos que suelen causan sntomas son los siguientes: ? Haematologist. ? Naranjas, limones y limas. ? Pimientos. ? Comidas condimentadas. ? Cebolla y Kae Heller. ? Vinagre. Estilo de  vida  Mantenga un peso saludable. Pregntele a su mdico cul es el peso saludable para usted. Si necesita perder peso, hable con su mdico para hacerlo de manera segura.  Realice actividad fsica durante, al menos, 30 minutos 5 das por semana o ms, o segn lo indicado por su mdico.  Use ropa suelta.  No fume. Si necesita ayuda para dejar de fumar, consulte al mdico.  Duerma con la cabecera de la cama ms elevada que los pies. Use una cua debajo del colchn o bloques debajo del armazn de la cama para Pharmacologist la cabecera de la cama elevada. Resumen  Si tiene enfermedad de reflujo gastroesofgico (ERGE), las elecciones de alimentos y el [place] de vida son muy importantes para ayudar a Paramedic los sntomas.  Haga comidas pequeas durante Glass blower/designer de 3 comidas abundantes. Coma lentamente y en un lugar donde est distendido.  Limite los alimentos con alto contenido graso como la carne grasa o los alimentos fritos.  Evite  agacharse o recostarse hasta 2 o 3horas despus de haber comido.  Evite la menta y [place] buena, la cafena, el alcohol y el chocolate. Esta informacin no tiene Theme park manager el consejo del mdico. Asegrese de hacerle al mdico cualquier pregunta que tenga. Document Revised: 03/10/2017 Document Reviewed: 03/10/2017 Elsevier Patient Education  2020 [employer].

## 2020-03-09 NOTE — Progress Notes (Signed)
[facility] Consult Note:  History: Aubrianna Orchard 03/09/2020  Referring provider: Jarome Lamas  Reason for consult/chief complaint: Heartburn (with acid reflux ), Anemia (per lab work at PCP), stool cards positive, and Rectal Bleeding (when she wipes after BM, bright red blood )  PCP referral says FOBT Positive x 3  Subjective  HPI:  This is a very pleasant 39 year old woman referred by primary care and seen with a Spanish interpreter.  She was found to have iron deficiency anemia with some labs a couple months back, referral note indicates stool was heme positive x3 as well. She has her menses monthly, typically bleeds about 3 days and it most goes through 3 or 4 pads on those days. She has some upper abdominal pain that seems to be fairly bandlike and intermittent, difficult to say what consistent triggers or relieving factors there might be.  Her appetite is good, weight stable.  Occasional nausea, no vomiting.  Appetite is good and weight stable.  Tends toward constipation and has occasionally had some blood on the paper, no frank rectal bleeding.  For the last few months she has had some intermittent episodes of nocturnal reflux causing regurgitation and heartburn, which may coincide with some weight gain over the last year. Denies dysphagia or odynophagia.  Primary care recommended she start a once daily iron supplement, which she is done for about the last 2 months.  Has not seen any worsening of the constipation with that.  No OTC or prescription remedies given for the constipation so far.  Patient feels that sometimes the upper abdominal pain is felt more toward the right and perhaps reminiscent of her previous biliary colic (cholecystectomy in 2015).  She believes primary care clinic told her she had "liver inflammation, and she has been concerned about that.  She has not had any abdominal imaging, and I informed her that her most recent liver labs were  normal.  ROS:  Review of Systems  Constitutional: Negative for appetite change and unexpected weight change.  HENT: Negative for mouth sores and voice change.   Eyes: Negative for pain and redness.  Respiratory: Negative for cough and shortness of breath.   Cardiovascular: Negative for chest pain and palpitations.  Genitourinary: Negative for dysuria and hematuria.  Musculoskeletal: Negative for arthralgias and myalgias.  Skin: Negative for pallor and rash.  Neurological: Negative for weakness and headaches.  Hematological: Negative for adenopathy.     Past Medical History: Past Medical History:  Diagnosis Date  . Abnormal Pap smear of cervix   . Abnormal uterine bleeding   . Hypertriglyceridemia   . IDA (iron deficiency anemia)   . Knee pain   . Obese   . Patellofemoral stress syndrome   . Plantar fasciitis   . Varicose veins of lower extremity      Past Surgical History: Past Surgical History:  Procedure Laterality Date  . CHOLECYSTECTOMY  11/2013     Family History: Family History  Problem Relation Age of Onset  . Diabetes Mother   . Heart attack Mother   . Diabetes Father   . Stomach cancer Brother   . Diabetes Brother   . Colon cancer Neg Hx   . Esophageal cancer Neg Hx   . Pancreatic cancer Neg Hx     Social History: Social History   Socioeconomic History  . Marital status: Single    Spouse name: Not on file  . Number of children: Not on file  . Years of education: Not  on file  . Highest education level: Not on file  Occupational History  . Not on file  Tobacco Use  . Smoking status: Never Smoker  . Smokeless tobacco: Never Used  Vaping Use  . Vaping Use: Never used  Substance and Sexual Activity  . Alcohol use: No    Alcohol/week: 0.0 standard drinks  . Drug use: No  . Sexual activity: Yes  Other Topics Concern  . Not on file  Social History Narrative   Lives with two children, sons, partner, and mother-in-law.    Social  Determinants of Health   Financial Resource Strain:   . Difficulty of Paying Living Expenses:   Food Insecurity:   . Worried About Programme researcher, broadcasting/film/video in the Last Year:   . Barista in the Last Year:   Transportation Needs:   . Freight forwarder (Medical):   Marland Kitchen Lack of Transportation (Non-Medical):   Physical Activity:   . Days of Exercise per Week:   . Minutes of Exercise per Session:   Stress:   . Feeling of Stress :   Social Connections:   . Frequency of Communication with Friends and Family:   . Frequency of Social Gatherings with Friends and Family:   . Attends Religious Services:   . Active Member of Clubs or Organizations:   . Attends Banker Meetings:   Marland Kitchen Marital Status:     Allergies: No Known Allergies  Outpatient Meds: No current outpatient medications on file.   No current facility-administered medications for this visit.      ___________________________________________________________________ Objective   Exam:  BP 102/68 (BP Location: Left Arm, Patient Position: Sitting, Cuff Size: Large)   Pulse 78   Ht 5' 1.5" (1.562 m)   Wt 179 lb 4 oz (81.3 kg)   SpO2 99%   BMI 33.32 kg/m    General: Well-appearing  Eyes: sclera anicteric, no redness  ENT: oral mucosa moist without lesions, no cervical or supraclavicular lymphadenopathy  CV: RRR without murmur, S1/S2, no JVD, no peripheral edema  Resp: clear to auscultation bilaterally, normal RR and effort noted  GI: soft, mild LUQ and RUQ tenderness, with active bowel sounds. No guarding or palpable organomegaly noted.  Obese  Skin; warm and dry, no rash or jaundice noted  Neuro: awake, alert and oriented x 3. Normal gross motor function and fluent speech  Labs:  12/21/19:  WBC 5.3    hgb 9.7   MCV 83   Hct 32  plt 370  TSH and CMP normal  Ferritin 7   Iron 45   TIBC 467   (10% sat)   Assessment: Encounter Diagnoses  Name Primary?  . Iron deficiency anemia due to  chronic blood loss Yes  . Heme positive stool   . Chronic constipation   . Heartburn   . Upper abdominal pain     Iron deficiency anemia, probably multifactorial from menstrual blood loss but also reportedly has heme positive stool. Upper abdominal pain, fairly diffuse, somewhat difficult to characterize.  Of unclear relation to the heme positive stool and anemia.  Several episodes of nocturnal reflux in the last few months.  GERD related diet and lifestyle measures reviewed, especially not eating within several hours of bed, elevating head of bed, as needed use of liquid antacid if symptoms occur overnight, weight loss.  Plan: 1/2-1 capful a day of MiraLAX to help relieve constipation, especially while on iron tablets.  CBC and iron studies today.  Colonoscopy and EGD.  She was agreeable after discussion of procedure and risks.  The benefits and risks of the planned procedure were described in detail with the patient or (when appropriate) their health care proxy.  Risks were outlined as including, but not limited to, bleeding, infection, perforation, adverse medication reaction leading to cardiac or pulmonary decompensation, pancreatitis (if ERCP).  The limitation of incomplete mucosal visualization was also discussed.  No guarantees or warranties were given.   Thank you for the courtesy of this consult.  Please call me with any questions or concerns.  Charlie Pitter III  CC: Referring provider noted above

## 2020-03-29 ENCOUNTER — Encounter: Payer: Self-pay | Admitting: Gastroenterology

## 2020-03-29 ENCOUNTER — Ambulatory Visit (AMBULATORY_SURGERY_CENTER): Payer: Self-pay | Admitting: Gastroenterology

## 2020-03-29 ENCOUNTER — Other Ambulatory Visit: Payer: Self-pay

## 2020-03-29 VITALS — BP 102/69 | HR 64 | Temp 97.1°F | Resp 12 | Ht 61.0 in | Wt 179.0 lb

## 2020-03-29 DIAGNOSIS — D500 Iron deficiency anemia secondary to blood loss (chronic): Secondary | ICD-10-CM

## 2020-03-29 DIAGNOSIS — R195 Other fecal abnormalities: Secondary | ICD-10-CM

## 2020-03-29 MED ORDER — SODIUM CHLORIDE 0.9 % IV SOLN: 500.0000 mL | Freq: Once | INTRAVENOUS | Status: DC

## 2020-03-29 NOTE — Progress Notes (Signed)
Interpreter ,[employer], present during admission.

## 2020-03-29 NOTE — Patient Instructions (Signed)
USTED TUVO UN PROCEDIMIENTO ENDOSCPICO HOY EN EL [facility]:   Lea el informe del procedimiento que se le entreg para cualquier pregunta especfica sobre lo que se encontr durante su examen.  Si el informe del examen no responde a sus preguntas, por favor llame a su gastroenterlogo para aclararlo.  Si usted solicit que no se le den detalles de lo que se encontr en su procedimiento al acompaante que le va a cuidar, entonces el informe del procedimiento se ha incluido en un sobre sellado para que usted lo revise despus cuando le sea ms conveniente.   LO QUE PUEDE ESPERAR: Algunas sensaciones de hinchazn en el abdomen.  Puede tener ms gases de lo normal.  El caminar puede ayudarle a eliminar el aire que se le puso en el tracto gastrointestinal durante el procedimiento y reducir la hinchazn.  Si le hicieron una endoscopia inferior (como una colonoscopia o una sigmoidoscopia flexible), podra notar manchas de sangre en las heces fecales o en el papel higinico.  Si se someti a una preparacin intestinal para su procedimiento, es posible que no tenga una evacuacin intestinal normal durante algunos das.   Tenga en cuenta:  Es posible que note un poco de irritacin y congestin en la nariz o algn drenaje.  Esto es debido al oxgeno utilizado durante su procedimiento.  No hay que preocuparse y esto debe desaparecer ms o menos en un da.   SNTOMAS PARA REPORTAR INMEDIATAMENTE:  Despus de una endoscopia inferior (colonoscopia o sigmoidoscopia flexible):  Cantidades excesivas de sangre en las heces fecales  Sensibilidad significativa o empeoramiento de los dolores abdominales   Hinchazn aguda del abdomen que antes no tena   Fiebre de 100F o ms   Despus de la endoscopia superior (EGD)  Vmitos de sangre o material como caf molido   Dolor en el pecho o dolor debajo de los omplatos que antes no tena   Dolor o dificultad persistente para tragar  Falta de aire que antes no  tena   Fiebre de 100F o ms  Heces fecales negras y pegajosas   Para asuntos urgentes o de emergencia, puede comunicarse con un gastroenterlogo a cualquier hora llamando al [phone].  DIETA:  Recomendamos una comida pequea al principio, pero luego puede continuar con su dieta normal.  Tome muchos lquidos, pero debe evitar las bebidas alcohlicas durante 24 horas.    ACTIVIDAD:  Debe planear tomarse las cosas con calma por el resto del da y no debe CONDUCIR ni usar maquinaria pesada hasta maana (debido a los medicamentos de sedacin utilizados durante el examen).     SEGUIMIENTO: Nuestro personal llamar al nmero que aparece en su historial al siguiente da hbil de su procedimiento para ver cmo se siente y para responder cualquier pregunta o inquietud que pueda tener con respecto a la informacin que se le dio despus del procedimiento. Si no podemos contactarle, le dejaremos un mensaje.  Sin embargo, si se siente bien y no tiene ningn problema, no es necesario que nos devuelva la llamada.  Asumiremos que ha regresado a sus actividades diarias normales sin incidentes. Si se le tomaron algunas biopsias, le contactaremos por telfono o por carta en las prximas 3 semanas.  Si no ha sabido nada sobre las biopsias en el transcurso de 3 semanas, por favor llmenos al [phone].   FIRMAS/CONFIDENCIALIDAD: Usted y/o el acompaante que le cuide han firmado documentos que se ingresarn en su historial mdico electrnico.  Estas firmas atestiguan el hecho   de que la informacin anterior  

## 2020-03-29 NOTE — Progress Notes (Signed)
1445: Interpreter Wallene Huh stated that she had to leave for another appointment.  Discharge instructions reviewed with family member, Mardene Celeste at bedside while pt was asleep.  Mardene Celeste stated to [facility] that all instructions were clear and that she had no further questions.

## 2020-03-29 NOTE — Op Note (Signed)
[facility] Patient Name: Audrey Black Procedure Date: 03/29/2020 2:06 PM MRN: [id number] Endoscopist: Sherilyn Cooter L. Myrtie Neither , MD Age: 39 Referring MD:  Date of Birth: [birth year] Gender: Female Account #: [id number] Procedure:                Upper GI endoscopy Indications:              Iron deficiency anemia secondary to chronic blood                            loss, Heme positive stool Medicines:                Monitored Anesthesia Care Procedure:                Pre-Anesthesia Assessment:                           - Prior to the procedure, a History and Physical                            was performed, and patient medications and                            allergies were reviewed. The patient's tolerance of                            previous anesthesia was also reviewed. The risks                            and benefits of the procedure and the sedation                            options and risks were discussed with the patient.                            All questions were answered, and informed consent                            was obtained. Prior Anticoagulants: The patient has                            taken no previous anticoagulant or antiplatelet                            agents. ASA Grade Assessment: II - A patient with                            mild systemic disease. After reviewing the risks                            and benefits, the patient was deemed in                            satisfactory condition to undergo the procedure.  After obtaining informed consent, the endoscope was                            passed under direct vision. Throughout the                            procedure, the patient's blood pressure, pulse, and                            oxygen saturations were monitored continuously. The                            Endoscope was introduced through the mouth, and                            advanced to the second part of  duodenum. The upper                            GI endoscopy was accomplished without difficulty.                            The patient tolerated the procedure well. Scope In: Scope Out: Findings:                 The esophagus was normal.                           The stomach was normal.                           The cardia and gastric fundus were normal on                            retroflexion.                           The examined duodenum was normal. Complications:            No immediate complications. Estimated Blood Loss:     Estimated blood loss: none. Impression:               - Normal esophagus.                           - Normal stomach.                           - Normal examined duodenum.                           - No specimens collected. Recommendation:           - Patient has a contact number available for                            emergencies. The signs and symptoms of potential                              delayed complications were discussed with the                            patient. Return to normal activities tomorrow.                            Written discharge instructions were provided to the                            patient.                           - Resume previous diet.                           - Continue present medications.                           - See the other procedure note for documentation of                            additional recommendations.                           - To visualize the small bowel, perform video                            capsule endoscopy at appointment to be scheduled.                           - Return to primary care physician to follow                            hemoglobin and iron levels and dose iron                            accordingly. Karmine Kauer L. Myrtie Neither, MD 03/29/2020 2:37:11 PM This report has been signed electronically.

## 2020-03-29 NOTE — Progress Notes (Signed)
PT taken to PACU. Monitors in place. VSS. Report given to RN. 

## 2020-03-29 NOTE — Progress Notes (Signed)
VS by BC. 

## 2020-03-29 NOTE — Op Note (Signed)
[facility] Patient Name: Audrey Black Procedure Date: 03/29/2020 2:06 PM MRN: [id number] Endoscopist: Sherilyn Cooter L. Myrtie Neither , MD Age: 39 Referring MD:  Date of Birth: 08-11-1981 Gender: Female Account #: [id number] Procedure:                Colonoscopy Indications:              Heme positive stool (reportedly, per PCP), Iron                            deficiency anemia secondary to chronic blood loss                            (hemoglobin lately improved from 9.5 to 11.5) Medicines:                Monitored Anesthesia Care Procedure:                Pre-Anesthesia Assessment:                           - Prior to the procedure, a History and Physical                            was performed, and patient medications and                            allergies were reviewed. The patient's tolerance of                            previous anesthesia was also reviewed. The risks                            and benefits of the procedure and the sedation                            options and risks were discussed with the patient.                            All questions were answered, and informed consent                            was obtained. Prior Anticoagulants: The patient has                            taken no previous anticoagulant or antiplatelet                            agents. ASA Grade Assessment: II - A patient with                            mild systemic disease. After reviewing the risks                            and benefits, the patient was deemed in  satisfactory condition to undergo the procedure.                           After obtaining informed consent, the colonoscope                            was passed under direct vision. Throughout the                            procedure, the patient's blood pressure, pulse, and                            oxygen saturations were monitored continuously. The                            Colonoscope was  introduced through the anus and                            advanced to the the terminal ileum, with                            identification of the appendiceal orifice and IC                            valve. The colonoscopy was performed without                            difficulty. The patient tolerated the procedure                            well. The quality of the bowel preparation was                            excellent. The terminal ileum, ileocecal valve,                            appendiceal orifice, and rectum were photographed. Scope In: 2:14:48 PM Scope Out: 2:24:27 PM Scope Withdrawal Time: 0 hours 6 minutes 45 seconds  Total Procedure Duration: 0 hours 9 minutes 39 seconds  Findings:                 The perianal and digital rectal examinations were                            normal.                           The terminal ileum appeared normal.                           The entire examined colon appeared normal on direct                            and retroflexion views. Complications:            No immediate complications. Estimated  Blood Loss:     Estimated blood loss: none. Impression:               - The examined portion of the ileum was normal.                           - The entire examined colon is normal on direct and                            retroflexion views.                           - No specimens collected.                           Multifactorial anemia with a contribution of heavy                            menstrual blood loss. Recommendation:           - Patient has a contact number available for                            emergencies. The signs and symptoms of potential                            delayed complications were discussed with the                            patient. Return to normal activities tomorrow.                            Written discharge instructions were provided to the                            patient.                            - Resume previous diet.                           - Continue present medications.                           - See the other procedure note for documentation of                            additional recommendations.                           - Repeat colonoscopy in 10 years for screening                            purposes. Maki Sweetser L. Myrtie Neither, MD 03/29/2020 2:34:25 PM This report has been signed electronically.

## 2020-04-02 ENCOUNTER — Telehealth: Payer: Self-pay | Admitting: *Deleted

## 2020-04-02 ENCOUNTER — Telehealth: Payer: Self-pay

## 2020-04-02 NOTE — Telephone Encounter (Signed)
No answer on follow up call. 

## 2020-04-02 NOTE — Telephone Encounter (Signed)
  Follow up Call-  Call back number 03/29/2020  Post procedure Call Back phone  # [phone]  Permission to leave phone message Yes  Some recent data might be hidden     Patient questions:  Do you have a fever, pain , or abdominal swelling? No. Pain Score  0 *  Have you tolerated food without any problems? Yes.    Have you been able to return to your normal activities? Yes.    Do you have any questions about your discharge instructions: Diet   No. Medications  No. Follow up visit  No.  Do you have questions or concerns about your Care? No.  Actions: * If pain score is 4 or above: No action needed, pain <4.   1. Have you developed a fever since your procedure? no  2.   Have you had an respiratory symptoms (SOB or cough) since your procedure? no  3.   Have you tested positive for COVID 19 since your procedure no  4.   Have you had any family members/close contacts diagnosed with the COVID 19 since your procedure?  no   If yes to any of these questions please route to Laverna Peace, RN and Karlton Lemon, RN

## 2020-04-02 NOTE — Telephone Encounter (Signed)
Pt states she is "doing fine"

## 2020-04-04 ENCOUNTER — Other Ambulatory Visit: Payer: Self-pay

## 2020-04-04 DIAGNOSIS — D500 Iron deficiency anemia secondary to blood loss (chronic): Secondary | ICD-10-CM

## 2020-04-04 DIAGNOSIS — R195 Other fecal abnormalities: Secondary | ICD-10-CM

## 2020-04-16 ENCOUNTER — Ambulatory Visit (INDEPENDENT_AMBULATORY_CARE_PROVIDER_SITE_OTHER): Payer: Self-pay | Admitting: Gastroenterology

## 2020-04-16 ENCOUNTER — Encounter: Payer: Self-pay | Admitting: Gastroenterology

## 2020-04-16 DIAGNOSIS — D500 Iron deficiency anemia secondary to blood loss (chronic): Secondary | ICD-10-CM

## 2020-04-16 NOTE — Progress Notes (Signed)
Lot number : 09-08-23 Exp : 2021-09-06 Serial number : [id number]  Pt tolerated capsule well and verbalized understanding of all written and verbal instructions

## 2020-04-16 NOTE — Patient Instructions (Signed)

## 2020-05-02 ENCOUNTER — Telehealth: Payer: Self-pay | Admitting: Gastroenterology

## 2020-05-02 NOTE — Telephone Encounter (Signed)
My apologies for the delay reviewing the study. I have done so, and it is normal.  No source of blood loss seen in the small bowel (and she recently had normal EGD and colonoscopy).  To reiterate my impressions in the endoscopy reports, I think the anemia is mostly, if not entirely, from menstrual blood loss.  PCP detected blood in stool but no visible cause for that. So those seem to be false positive tests.  Her blood counts were improving on iron tablets, and I expect they will return to normal with that treatment.  If she has not seen PCP since her procedures with me, then I recommend doing so in the next few weeks to check blood counts and iron levels.  - HD

## 2020-05-02 NOTE — Telephone Encounter (Signed)
Pt called inquiring about results of upper GI and capsule that she had done two weeks ago.

## 2020-05-03 NOTE — Telephone Encounter (Signed)
Spoke with patient regarding capsule endoscopy results, apologized to patient for the delay. Discussed recommendations from Dr. Myrtie Neither. Advised patient that Dr.Danis thinks labs will return to normal if she continues iron supplement. Advised patient that she should follow up with her PCP so that they can recheck her blood counts and iron levels. Pt did not have any questions or concerns at this time

## 2021-11-18 ENCOUNTER — Ambulatory Visit: Payer: Self-pay | Admitting: Obstetrics & Gynecology

## 2021-11-25 ENCOUNTER — Ambulatory Visit: Payer: Self-pay | Admitting: Obstetrics & Gynecology

## 2021-11-29 ENCOUNTER — Ambulatory Visit (INDEPENDENT_AMBULATORY_CARE_PROVIDER_SITE_OTHER): Payer: Self-pay | Admitting: Obstetrics & Gynecology

## 2021-11-29 ENCOUNTER — Encounter: Payer: Self-pay | Admitting: Obstetrics & Gynecology

## 2021-11-29 VITALS — BP 120/78 | HR 72 | Resp 16 | Ht 61.75 in | Wt 182.0 lb

## 2021-11-29 DIAGNOSIS — R9389 Abnormal findings on diagnostic imaging of other specified body structures: Secondary | ICD-10-CM

## 2021-11-29 DIAGNOSIS — R1030 Lower abdominal pain, unspecified: Secondary | ICD-10-CM

## 2021-11-29 DIAGNOSIS — N898 Other specified noninflammatory disorders of vagina: Secondary | ICD-10-CM

## 2021-11-29 DIAGNOSIS — N921 Excessive and frequent menstruation with irregular cycle: Secondary | ICD-10-CM

## 2021-11-29 LAB — WET PREP FOR TRICH, YEAST, CLUE

## 2021-11-29 MED ORDER — FLUCONAZOLE 150 MG PO TABS: 150.0000 mg | ORAL_TABLET | ORAL | 1 refills | Status: DC

## 2021-11-29 NOTE — Progress Notes (Signed)
? ? ?  Audrey Black 08/27/1980 [id number] ? ? ?     41 y.o.  G3P3L3 Married ? ?RP: Counseling and management of BTB with Thickened Endometrium ? ?HPI: Patient was investigated for BTB and found to have a thickened Endometrium with a possible Polyp on Pelvic US.  Pt c/o lower pelvic pain, vaginal itching & pulling in lower pelvic area LMP 11/15/2021 normal.  Usually having regular menses every month.  Will use condoms for contraception.  Urine/BMs normal. ? ? ?OB History  ?Gravida Para Term Preterm AB Living  ?3 3 3     3   ?SAB IAB Ectopic Multiple Live Births  ?        3  ?  ?# Outcome Date GA Lbr Len/2nd Weight Sex Delivery Anes PTL Lv  ?3 Term           ?2 Term           ?1 Term           ? ? ?Past medical history,surgical history, problem list, medications, allergies, family history and social history were all reviewed and documented in the EPIC chart. ? ? ?Directed ROS with pertinent positives and negatives documented in the history of present illness/assessment and plan. ? ?Exam: ? ?Vitals:  ? 11/29/21 0858  ?BP: 120/78  ?Pulse: 72  ?Resp: 16  ?Weight: 182 lb (82.6 kg)  ?Height: 5' 1.75" (1.568 m)  ? ?General appearance:  Normal ? ?Abdomen: Normal ? ?Gynecologic exam: Vulva normal.  Speculum:  Cervix/Vagina normal.  Mild discharge.  Wet prep done.  Bimanual exam:  Uterus AV, mobile, NT.  No adnexal mass, NT bilaterally. ? ?Pelvic 12/01/21 (outside [place]): Uterus normal is size at 9.5 x 4.4 x 6.0 cm.  The endometrial stripe is thick and heterogeneous, measuring up to 1.8 cm in width.  Suggestion of a hyperechoic oblong lesion.  There is no free pelvic fluid. Small inspissated nabothian cyst is noted on the cervix.  Right ovary normal.  Left ovary normal. ? ? ?Assessment/Plan:  41 y.o. G3P3003  ? ?1. Metrorrhagia ?Patient was investigated for BTB and found to have a thickened Endometrium with a possible Polyp on Pelvic 20.  Pt c/o lower pelvic pain, vaginal itching & pulling in lower pelvic area LMP 11/15/2021 normal.  Usually  having regular menses every month.  Will use condoms for contraception.  Urine/BMs normal.  F/U Sonohysto/possible EBx per findings or will schedule a HSC/Excision/D+C if an IU lesion is present. ?- 11/17/2021 Sonohysterogram; Future ? ?2. Thickened endometrium ?Patient was investigated for BTB and found to have a thickened Endometrium with a possible Polyp on Pelvic US. Will further investigate with a Sonohysto here at next visit. ?- [place] Sonohysterogram; Future ? ?3. Vaginal itching ?Clinical yeast vaginitis, will treat with Fluconazole.  No CI.  Usage reviewed and prescription sent to pharmacy. ?- WET PREP FOR TRICH, YEAST, CLUE ? ?4. Lower abdominal pain ?Probably GI origin.  Counseling done.  Gastro as needed. ? ?Other orders ?- fluconazole (DIFLUCAN) 150 MG tablet; Take 1 tablet (150 mg total) by mouth every other day. Every other day x 3 doses as needed.  ? ?[place] MD, 9:36 AM 11/29/2021 ? ? ? ?  ?

## 2021-12-17 ENCOUNTER — Encounter: Payer: Self-pay | Admitting: Obstetrics & Gynecology

## 2022-01-02 ENCOUNTER — Encounter: Payer: Self-pay | Admitting: Obstetrics & Gynecology

## 2022-01-02 ENCOUNTER — Telehealth: Payer: Self-pay | Admitting: *Deleted

## 2022-01-02 ENCOUNTER — Other Ambulatory Visit: Payer: Self-pay

## 2022-01-02 ENCOUNTER — Ambulatory Visit: Payer: Self-pay | Admitting: Obstetrics & Gynecology

## 2022-01-02 ENCOUNTER — Ambulatory Visit: Payer: Self-pay

## 2022-01-02 ENCOUNTER — Other Ambulatory Visit: Payer: Self-pay | Admitting: Obstetrics & Gynecology

## 2022-01-02 VITALS — BP 120/84

## 2022-01-02 DIAGNOSIS — N921 Excessive and frequent menstruation with irregular cycle: Secondary | ICD-10-CM

## 2022-01-02 DIAGNOSIS — N840 Polyp of corpus uteri: Secondary | ICD-10-CM

## 2022-01-02 DIAGNOSIS — Z01812 Encounter for preprocedural laboratory examination: Secondary | ICD-10-CM | POA: Diagnosis not present

## 2022-01-02 DIAGNOSIS — R9389 Abnormal findings on diagnostic imaging of other specified body structures: Secondary | ICD-10-CM

## 2022-01-02 LAB — PREGNANCY, URINE: Preg Test, Ur: NEGATIVE

## 2022-01-02 NOTE — Telephone Encounter (Signed)
Please contact patient and offer surgery date of 6/21 or 7/19.

## 2022-01-02 NOTE — Telephone Encounter (Signed)
-----   Message from Genia Del, MD sent at 01/02/2022 11:12 AM EDT ----- Regarding: Schedule surgery Surgery: CPT 58558 - Hysteroscopy/D&C/Myosure  Diagnosis: R93.89 Thickened Endometrium, N84.0 Endometrial Polyp  Location: [facility]  Status: Outpatient  Time: 30 Minutes  Assistant: N/A  Urgency: First Available  Pre-Op Appointment: To Be Scheduled  Post-Op Appointment(s): 2 Weeks  Time Out Of Work: Day Of Surgery ONLY

## 2022-01-02 NOTE — Progress Notes (Signed)
Audrey Black 31-Jan-1981 [id number]        41 y.o.  G3P3L3   RP: Metrorrhagia with thickened endometrium on outside [place] for Sonohystogram  HPI:  Patient was investigated for BTB and found to have a thickened Endometrium with a possible Polyp on Pelvic US.  Pt c/o lower pelvic pain, vaginal itching & pulling in lower pelvic area LMP 12/15/2021 normal.  Usually having regular menses every month.  Will use condoms for contraception.  Urine/BMs normal.  Pelvic US (outside [place]): Uterus normal is size at 9.5 x 4.4 x 6.0 cm.  The endometrial stripe is thick and heterogeneous, measuring up to 1.8 cm in width.  Suggestion of a hyperechoic oblong lesion.  There is no free pelvic fluid. Small inspissated nabothian cyst is noted on the cervix.  Right ovary normal.  Left ovary normal.  OB History  Gravida Para Term Preterm AB Living  3 3 3     3   SAB IAB Ectopic Multiple Live Births          3    # Outcome Date GA Lbr Len/2nd Weight Sex Delivery Anes PTL Lv  3 Term           2 Term           1 Term             Past medical history,surgical history, problem list, medications, allergies, family history and social history were all reviewed and documented in the EPIC chart.   Directed ROS with pertinent positives and negatives documented in the history of present illness/assessment and plan.  Exam:  Vitals:   01/02/22 1015  BP: 120/84   General appearance:  Normal   Pelvic 01/04/22 today: T/V images.  Anteverted uterus normal in size and shape with no myometrial mass.  The uterus is measured at 9.01 x 6.66 x 4.92 cm.  Thickened and asymmetrical endometrium measuring 24.9 mm.  2 focal echogenic areas corresponding to probable endometrial polyps are measured at 1 cm x 0.9 cm and 0.9 cm x 0.5 cm, best seen on 3D images.  Both ovaries are mobile, normal in size with normal follicular pattern.  Resolving corpus luteum on the right ovary.  Normal perfusion of both ovaries.  No adnexal mass.  No free fluid in  the pelvis.  UPT Neg   Assessment/Plan:  41 y.o. G3P3003   1. Metrorrhagia Patient was investigated for BTB and found to have a thickened Endometrium with a possible Polyp on Pelvic 20.  Pt c/o lower pelvic pain, vaginal itching & pulling in lower pelvic area LMP 12/15/2021 normal.  Usually having regular menses every month.  Will use condoms for contraception.  UPT Neg today.  Pelvic 12/17/2021 findings thoroughly reviewed.  Given the obvious endometrial lesions, probable polyps, the Sonohysto was not done.  Decision to proceed direction to HSC/Myosure Excisions/D+C.  Preop preparation, Surgery and Risks and Post op precautions and expectations reviewed.  F/U Preop visit.  2. Thickened endometrium As above.  3. Endometrial polyp Probably at least 2 polyps per Pelvic US today.  4. Encounter for preprocedural laboratory examination UPT Negative - Pregnancy, urine  Other orders - ferrous sulfate 325 (65 FE) MG EC tablet; Take 1 tablet by mouth daily. - SUMAtriptan (IMITREX) 50 MG tablet; Take 50 mg by mouth every 2 (two) hours as needed for migraine. May repeat in 2 hours if headache persists or recurs.   [place] MD, 10:58 AM 01/02/2022

## 2022-01-03 NOTE — Telephone Encounter (Signed)
Surgery request sent for 02/05/22.

## 2022-01-03 NOTE — Telephone Encounter (Signed)
Spoke with patient and would like surgery for 02/05/2022 please.

## 2022-01-06 NOTE — Telephone Encounter (Signed)
Thurnell Garbe A, CMA  You 17 minutes ago (10:58 AM)   All information regarding pre-op was given to patient. All information was given in [place]. Pre-op and post-op appointments have been scheduled. Advised patient to call back if any additional questions.     Instructions placed in outgoing mail.   Routing to provider for final review. Patient is agreeable to disposition. Will close encounter.  Cc: Hayley, [employer]

## 2022-01-06 NOTE — Telephone Encounter (Signed)
Pre-op instructions to [place] to call patient and review.   Will need pre-op and 2 wk postop.

## 2022-01-06 NOTE — Telephone Encounter (Signed)
Per Elane Fritz, spoke with patient regarding surgery benefits. Patient acknowledges understanding of information presented. Patient is aware that benefits presented are professional benefits only. Patient is aware the hospital will call with facility benefits. See account note.  Routing to Carmelina Dane, Charity fundraiser.

## 2022-01-10 NOTE — Telephone Encounter (Signed)
Surgery check list scanned in chart.Encounter closed 

## 2022-01-29 ENCOUNTER — Ambulatory Visit (INDEPENDENT_AMBULATORY_CARE_PROVIDER_SITE_OTHER): Payer: Self-pay | Admitting: Obstetrics & Gynecology

## 2022-01-29 ENCOUNTER — Encounter: Payer: Self-pay | Admitting: Obstetrics & Gynecology

## 2022-01-29 VITALS — BP 112/80 | HR 91 | Ht 61.75 in | Wt 185.0 lb

## 2022-01-29 DIAGNOSIS — N840 Polyp of corpus uteri: Secondary | ICD-10-CM

## 2022-01-29 DIAGNOSIS — N921 Excessive and frequent menstruation with irregular cycle: Secondary | ICD-10-CM

## 2022-01-29 DIAGNOSIS — R9389 Abnormal findings on diagnostic imaging of other specified body structures: Secondary | ICD-10-CM

## 2022-01-29 NOTE — Progress Notes (Signed)
    Audrey Black 1980-11-12 [id number]        40 y.o.  G3P3L3   RP: Preop HSC/Myosure Excision/D+C  HPI: Had a menstrual period on 01/16/22 with blood clots, but lasted only 3 days.  Not bleeding currently.  No pelvic pain.   OB History  Gravida Para Term Preterm AB Living  3 3 3     3   SAB IAB Ectopic Multiple Live Births          3    # Outcome Date GA Lbr Len/2nd Weight Sex Delivery Anes PTL Lv  3 Term           2 Term           1 Term             Past medical history,surgical history, problem list, medications, allergies, family history and social history were all reviewed and documented in the EPIC chart.   Directed ROS with pertinent positives and negatives documented in the history of present illness/assessment and plan.  Exam:  Vitals:   01/29/22 1001  BP: 112/80  Pulse: 91  SpO2: 97%  Weight: 185 lb (83.9 kg)  Height: 5' 1.75" (1.568 m)   General appearance:  Normal  Pelvic US 01/02/22: T/V images.  Anteverted uterus normal in size and shape with no myometrial mass.  The uterus is measured at 9.01 x 6.66 x 4.92 cm.  Thickened and asymmetrical endometrium measuring 24.9 mm.  2 focal echogenic areas corresponding to probable endometrial polyps are measured at 1 cm x 0.9 cm and 0.9 cm x 0.5 cm, best seen on 3D images.  Both ovaries are mobile, normal in size with normal follicular pattern.  Resolving corpus luteum on the right ovary.  Normal perfusion of both ovaries.  No adnexal mass.  No free fluid in the pelvis.   Assessment/Plan:  41 y.o. G3P3003   1. Metrorrhagia Had a menstrual period on 01/16/22 with blood clots, but lasted only 3 days.  Not bleeding currently.  No pelvic pain.  Pelvic US findings thoroughly reviewed.  Given the obvious endometrial lesions, probable polyps x 2, the Sonohysto was not done.  Decision to proceed directly to HSC/Myosure Excisions/D+C.  Preop preparation, Surgery and Risks including the risk of uterine perforation and Post op  precautions and expectations reviewed.  Patient voiced understanding and agreement.  2. Thickened endometrium As above.  3. Endometrial polyp At least 2 Polyps.  Other orders - SUMAtriptan (IMITREX) 25 MG tablet; Take by mouth.                         Patient was counseled as to the risk of surgery to include the following:  1. Infection (prohylactic antibiotics will be administered)  2. DVT/Pulmonary Embolism (prophylactic pneumo compression stockings will be used)  3.Trauma to internal organs requiring additional surgical procedure to repair any injury to internal organs requiring perhaps additional hospitalization days.  4.Hemmorhage requiring transfusion and blood products which carry risks such as anaphylactic reaction, hepatitis and AIDS  Patient had received literature information on the procedure scheduled and all her questions were answered and fully accepts all risk.    Princess Bruins MD, 10:27 AM 01/29/2022

## 2022-01-30 ENCOUNTER — Other Ambulatory Visit: Payer: Self-pay

## 2022-01-30 ENCOUNTER — Encounter (HOSPITAL_BASED_OUTPATIENT_CLINIC_OR_DEPARTMENT_OTHER): Payer: Self-pay | Admitting: Obstetrics & Gynecology

## 2022-01-30 NOTE — Progress Notes (Addendum)
Spoke w/ via phone for pre-op interview---pt with spanish interpreter # [phone] [employer] Lab needs dos----   cbc urine preg poct            Lab results------none COVID test -----patient states asymptomatic no test needed Arrive at -------930 AM 02-05-2022 NPO after MN. Med rec completed Medications to take morning of surgery -----none Diabetic medication -----n/a Patient instructed no nail polish to be worn day of surgery Patient instructed to bring photo id and insurance card day of surgery Patient aware to have Driver (ride ) / caregiver    mother in law juana will stay for 24 hours after surgery  Patient Special Instructions -----none Pre-Op special Istructions -----none Patient verbalized understanding of instructions that were given at this phone interview. Patient denies shortness of breath, chest pain, fever, cough at this phone interview.   Spanish interpreter requested for dos (no gender preference email on chart.

## 2022-02-05 ENCOUNTER — Other Ambulatory Visit: Payer: Self-pay

## 2022-02-05 ENCOUNTER — Encounter (HOSPITAL_BASED_OUTPATIENT_CLINIC_OR_DEPARTMENT_OTHER)
Admission: RE | Disposition: A | Discharge status: Home / Self Care | Payer: Self-pay | Source: Home / Self Care | Attending: Obstetrics & Gynecology

## 2022-02-05 ENCOUNTER — Ambulatory Visit (HOSPITAL_BASED_OUTPATIENT_CLINIC_OR_DEPARTMENT_OTHER): Payer: Self-pay | Admitting: Anesthesiology

## 2022-02-05 ENCOUNTER — Ambulatory Visit (HOSPITAL_BASED_OUTPATIENT_CLINIC_OR_DEPARTMENT_OTHER)
Admission: RE | Admit: 2022-02-05 | Discharge: 2022-02-05 | Disposition: A | Discharge status: Home / Self Care | Payer: Self-pay | Attending: Obstetrics & Gynecology | Admitting: Obstetrics & Gynecology

## 2022-02-05 ENCOUNTER — Encounter (HOSPITAL_BASED_OUTPATIENT_CLINIC_OR_DEPARTMENT_OTHER): Payer: Self-pay | Admitting: Obstetrics & Gynecology

## 2022-02-05 DIAGNOSIS — N840 Polyp of corpus uteri: Secondary | ICD-10-CM

## 2022-02-05 DIAGNOSIS — K219 Gastro-esophageal reflux disease without esophagitis: Secondary | ICD-10-CM | POA: Insufficient documentation

## 2022-02-05 DIAGNOSIS — R9389 Abnormal findings on diagnostic imaging of other specified body structures: Secondary | ICD-10-CM

## 2022-02-05 DIAGNOSIS — Z01818 Encounter for other preprocedural examination: Secondary | ICD-10-CM

## 2022-02-05 LAB — CBC
HCT: 32.9 % — ABNORMAL LOW (ref 36.0–46.0)
Hemoglobin: 10.2 g/dL — ABNORMAL LOW (ref 12.0–15.0)
MCH: 25.1 pg — ABNORMAL LOW (ref 26.0–34.0)
MCHC: 31 g/dL (ref 30.0–36.0)
MCV: 80.8 fL (ref 80.0–100.0)
Platelets: 417 10*3/uL — ABNORMAL HIGH (ref 150–400)
RBC: 4.07 MIL/uL (ref 3.87–5.11)
RDW: 16.8 % — ABNORMAL HIGH (ref 11.5–15.5)
WBC: 6.6 10*3/uL (ref 4.0–10.5)
nRBC: 0 % (ref 0.0–0.2)

## 2022-02-05 LAB — POCT PREGNANCY, URINE: Preg Test, Ur: NEGATIVE

## 2022-02-05 SURGERY — DILATATION & CURETTAGE/HYSTEROSCOPY WITH MYOSURE
Anesthesia: General | Site: Uterus

## 2022-02-05 MED ORDER — FENTANYL CITRATE (PF) 100 MCG/2ML IJ SOLN: 25.0000 ug | INTRAMUSCULAR | Status: DC | PRN

## 2022-02-05 MED ORDER — ONDANSETRON HCL 4 MG/2ML IJ SOLN
INTRAMUSCULAR | Status: DC | PRN
  Administered 2022-02-05: 4 mg via INTRAVENOUS

## 2022-02-05 MED ORDER — OXYCODONE HCL 5 MG/5ML PO SOLN: 5.0000 mg | Freq: Once | ORAL | Status: AC | PRN

## 2022-02-05 MED ORDER — OXYCODONE HCL 5 MG PO TABS
ORAL_TABLET | ORAL | Status: AC
  Filled 2022-02-05: qty 1

## 2022-02-05 MED ORDER — LACTATED RINGERS IV SOLN: INTRAVENOUS | Status: DC

## 2022-02-05 MED ORDER — AMISULPRIDE (ANTIEMETIC) 5 MG/2ML IV SOLN: 10.0000 mg | Freq: Once | INTRAVENOUS | Status: DC | PRN

## 2022-02-05 MED ORDER — PROPOFOL 10 MG/ML IV BOLUS
INTRAVENOUS | Status: AC
  Filled 2022-02-05: qty 20

## 2022-02-05 MED ORDER — CEFAZOLIN SODIUM-DEXTROSE 2-4 GM/100ML-% IV SOLN
2.0000 g | INTRAVENOUS | Status: AC
  Administered 2022-02-05: 2 g via INTRAVENOUS

## 2022-02-05 MED ORDER — SODIUM CHLORIDE 0.9 % IR SOLN
Status: DC | PRN
  Administered 2022-02-05: 1644 mL

## 2022-02-05 MED ORDER — PROPOFOL 10 MG/ML IV BOLUS
INTRAVENOUS | Status: DC | PRN
  Administered 2022-02-05: 200 mg via INTRAVENOUS

## 2022-02-05 MED ORDER — FENTANYL CITRATE (PF) 100 MCG/2ML IJ SOLN
INTRAMUSCULAR | Status: AC
  Filled 2022-02-05: qty 2

## 2022-02-05 MED ORDER — KETOROLAC TROMETHAMINE 30 MG/ML IJ SOLN
INTRAMUSCULAR | Status: DC | PRN
  Administered 2022-02-05: 30 mg via INTRAVENOUS

## 2022-02-05 MED ORDER — FENTANYL CITRATE (PF) 250 MCG/5ML IJ SOLN
INTRAMUSCULAR | Status: DC | PRN
  Administered 2022-02-05: 50 ug via INTRAVENOUS

## 2022-02-05 MED ORDER — DEXAMETHASONE SODIUM PHOSPHATE 10 MG/ML IJ SOLN
INTRAMUSCULAR | Status: DC | PRN
  Administered 2022-02-05: 10 mg via INTRAVENOUS

## 2022-02-05 MED ORDER — MIDAZOLAM HCL 2 MG/2ML IJ SOLN
INTRAMUSCULAR | Status: AC
  Filled 2022-02-05: qty 2

## 2022-02-05 MED ORDER — MIDAZOLAM HCL 2 MG/2ML IJ SOLN
INTRAMUSCULAR | Status: DC | PRN
  Administered 2022-02-05: 2 mg via INTRAVENOUS

## 2022-02-05 MED ORDER — LIDOCAINE 2% (20 MG/ML) 5 ML SYRINGE
INTRAMUSCULAR | Status: DC | PRN
  Administered 2022-02-05: 100 mg via INTRAVENOUS

## 2022-02-05 MED ORDER — ONDANSETRON HCL 4 MG/2ML IJ SOLN: 4.0000 mg | Freq: Once | INTRAMUSCULAR | Status: DC | PRN

## 2022-02-05 MED ORDER — ACETAMINOPHEN 500 MG PO TABS
1000.0000 mg | ORAL_TABLET | Freq: Once | ORAL | Status: AC
  Administered 2022-02-05: 1000 mg via ORAL

## 2022-02-05 MED ORDER — OXYCODONE HCL 5 MG PO TABS
5.0000 mg | ORAL_TABLET | Freq: Once | ORAL | Status: AC | PRN
  Administered 2022-02-05: 5 mg via ORAL

## 2022-02-05 MED ORDER — ACETAMINOPHEN 500 MG PO TABS
ORAL_TABLET | ORAL | Status: AC
  Filled 2022-02-05: qty 2

## 2022-02-05 MED ORDER — CEFAZOLIN SODIUM-DEXTROSE 2-4 GM/100ML-% IV SOLN
INTRAVENOUS | Status: AC
  Filled 2022-02-05: qty 100

## 2022-02-05 MED ORDER — LIDOCAINE HCL 1 % IJ SOLN
INTRAMUSCULAR | Status: DC | PRN
  Administered 2022-02-05: 20 mL

## 2022-02-05 SURGICAL SUPPLY — 26 items
CATH ROBINSON RED A/P 16FR (CATHETERS) ×2 IMPLANT
CNTNR URN SCR LID CUP LEK RST (MISCELLANEOUS) IMPLANT
CONT SPEC 4OZ STRL OR WHT (MISCELLANEOUS) ×2
DEVICE MYOSURE LITE (MISCELLANEOUS) IMPLANT
DEVICE MYOSURE REACH (MISCELLANEOUS) ×1 IMPLANT
DILATOR CANAL MILEX (MISCELLANEOUS) IMPLANT
DRSG TELFA 3X8 NADH (GAUZE/BANDAGES/DRESSINGS) IMPLANT
ELECT REM PT RETURN 9FT ADLT (ELECTROSURGICAL)
ELECTRODE REM PT RTRN 9FT ADLT (ELECTROSURGICAL) IMPLANT
GAUZE 4X4 16PLY ~~LOC~~+RFID DBL (SPONGE) ×3 IMPLANT
GLOVE BIO SURGEON STRL SZ 6.5 (GLOVE) ×2 IMPLANT
GLOVE BIOGEL PI IND STRL 7.0 (GLOVE) ×2 IMPLANT
GLOVE BIOGEL PI INDICATOR 7.0 (GLOVE) ×2
GOWN STRL REUS W/TWL LRG LVL3 (GOWN DISPOSABLE) ×4 IMPLANT
IV NS IRRIG 3000ML ARTHROMATIC (IV SOLUTION) ×2 IMPLANT
KIT PROCEDURE FLUENT (KITS) ×2 IMPLANT
KIT TURNOVER CYSTO (KITS) ×2 IMPLANT
MYOSURE XL FIBROID (MISCELLANEOUS)
PACK VAGINAL MINOR WOMEN LF (CUSTOM PROCEDURE TRAY) ×2 IMPLANT
PAD DRESSING TELFA 3X8 NADH (GAUZE/BANDAGES/DRESSINGS) ×1 IMPLANT
PAD OB MATERNITY 4.3X12.25 (PERSONAL CARE ITEMS) ×2 IMPLANT
PAD PREP 24X48 CUFFED NSTRL (MISCELLANEOUS) ×2 IMPLANT
SEAL CERVICAL OMNI LOK (ABLATOR) IMPLANT
SEAL ROD LENS SCOPE MYOSURE (ABLATOR) ×2 IMPLANT
SYSTEM TISS REMOVAL MYOSURE XL (MISCELLANEOUS) IMPLANT
TOWEL OR 17X26 10 PK STRL BLUE (TOWEL DISPOSABLE) ×2 IMPLANT

## 2022-02-05 NOTE — Op Note (Signed)
Operative Note  02/05/2022  11:17 AM  PATIENT:  Audrey Black  41 y.o. female  PRE-OPERATIVE DIAGNOSIS:  Thickened endometrium, endometrial polyp  POST-OPERATIVE DIAGNOSIS:  Thickened endometrium, Endometrial polyp  PROCEDURE:  Procedure(s): DILATATION & CURETTAGE/HYSTEROSCOPY WITH MYOSURE  SURGEON:  Surgeon(s): Genia Del, MD  ANESTHESIA:   general  FINDINGS: Polypy, thickened endometrium at the posterior wall of the endometrial cavity.  Both ostia well seen.  DESCRIPTION OF OPERATION: Under general anesthesia with laryngeal mask, the patient is in lithotomy position.  She is prepped with Betadine on the suprapubic, vulvar and vaginal areas.  She is draped as usual.  Timeout is done.  Patient had emptied her bladder before entering the operating room.  The vaginal exam reveals an anteverted uterus, normal volume, mobile.  No adnexal mass.  The speculum is inserted in the vagina.  The anterior lip of the cervix is grasped with a tenaculum.  A paracervical block is done with lidocaine 1% a total of 20 cc at 4 and 8:00.  Dilation of the cervix with Pratt dilators up to #19 without difficulty.  Insertion of the hysteroscope in the intra uterine cavity.  Inspection reveals 2 normal ostia.  The posterior aspect of the intra uterine cavity presents a thickened endometrium that is polypy.  Pictures are taken.  The MyoSure reach is inserted through the hysteroscope.  The polypy thickened endometrium is excised.  Pictures are taken after excision.  Hemostasis is adequate.  The hysteroscope with MyoSure are removed.  A systematic curettage of the intra uterine cavity on all surfaces is done with a sharp curette.  The curette is removed.  The specimen of endometrial curettings is sent together with the excision material to pathology.  The tenaculum is removed from the cervix.  Hemostasis is adequate.  The speculum is removed.  The patient is brought to recovery room in good and stable  status.  ESTIMATED BLOOD LOSS: 3 mL Fluid Deficit: 400 mL  Intake/Output Summary (Last 24 hours) at 02/05/2022 1117 Last data filed at 02/05/2022 1111 Gross per 24 hour  Intake 500 ml  Output 3 ml  Net 497 ml     BLOOD ADMINISTERED:none   LOCAL MEDICATIONS USED:  LIDOCAINE 1% 20 cc for Paracervical Block  SPECIMEN:  Source of Specimen:  Myosure excision material from thickened and polypy areas, endometrial curettings.  DISPOSITION OF SPECIMEN:  PATHOLOGY  COUNTS:  YES  PLAN OF CARE: Transfer to PACU  Marie-Lyne LavoieMD11:17 AM

## 2022-02-05 NOTE — Anesthesia Postprocedure Evaluation (Signed)
Anesthesia Post Note  Patient: Audrey Black  Procedure(s) Performed: DILATATION & CURETTAGE/HYSTEROSCOPY WITH MYOSURE (Uterus)     Patient location during evaluation: PACU Anesthesia Type: General Level of consciousness: awake and alert Pain management: pain level controlled Vital Signs Assessment: post-procedure vital signs reviewed and stable Respiratory status: spontaneous breathing, nonlabored ventilation and respiratory function stable Cardiovascular status: blood pressure returned to baseline and stable Postop Assessment: no apparent nausea or vomiting Anesthetic complications: no   No notable events documented.  Last Vitals:  Vitals:   02/05/22 1215 02/05/22 1320  BP: 113/85 122/71  Pulse: 64 72  Resp: 17 16  Temp:  36.6 C  SpO2: 100% 100%    Last Pain:  Vitals:   02/05/22 1320  TempSrc:   PainSc: 1                  Lucretia Kern

## 2022-02-05 NOTE — Discharge Instructions (Addendum)
No acetaminophen/Tylenol until after 3:45 pm today if needed.   No ibuprofen, Advil, Aleve, Motrin, ketorolac, meloxicam, naproxen, or other NSAIDS until after 5:00 pm today if needed.   Post Anesthesia Home Care Instructions  Activity: Get plenty of rest for the remainder of the day. A responsible individual must stay with you for 24 hours following the procedure.  For the next 24 hours, DO NOT: -Drive a car -Advertising copywriter -Drink alcoholic beverages -Take any medication unless instructed by your physician -Make any legal decisions or sign important papers.  Meals: Start with liquid foods such as gelatin or soup. Progress to regular foods as tolerated. Avoid greasy, spicy, heavy foods. If nausea and/or vomiting occur, drink only clear liquids until the nausea and/or vomiting subsides. Call your physician if vomiting continues.  Special Instructions/Symptoms: Your throat may feel dry or sore from the anesthesia or the breathing tube placed in your throat during surgery. If this causes discomfort, gargle with warm salt water. The discomfort should disappear within 24 hours.

## 2022-02-05 NOTE — H&P (Signed)
Audrey Black is an 41 y.o. female. [id number]    RP: HSC/Myosure Excision/D+C   HPI: No change x last visit.  Had a menstrual period on 01/16/22 with blood clots, but lasted only 3 days.  Not bleeding currently.  No pelvic pain.    Pertinent Gynecological History: Menses: flow is moderate to heavy Contraception: condoms Blood transfusions: none Sexually transmitted diseases: no past history Last pap: normal  OB History: G3P3   Menstrual History:  Patient's last menstrual period was 01/16/2022 (exact date).    Past Medical History:  Diagnosis Date   Abnormal Pap smear of cervix    Abnormal uterine bleeding    Hypertriglyceridemia    IDA (iron deficiency anemia)    Knee pain    Migraines    Obese    Patellofemoral stress syndrome    Plantar fasciitis    Varicose veins of lower extremity     Past Surgical History:  Procedure Laterality Date   CHOLECYSTECTOMY  11/2013    Family History  Problem Relation Age of Onset   Diabetes Mother    Heart attack Mother    Diabetes Father    Heart attack Father    Diabetes Sister    Cancer Brother        stomach cancer   Diabetes Brother    Stomach cancer Brother    Diabetes Brother     Social History:  reports that she has never smoked. She has never used smokeless tobacco. She reports current alcohol use. She reports that she does not use drugs.  Allergies: No Known Allergies  Medications Prior to Admission  Medication Sig Dispense Refill Last Dose   ferrous sulfate 325 (65 FE) MG EC tablet Take 1 tablet by mouth daily.   Past Month   SUMAtriptan (IMITREX) 25 MG tablet Take by mouth every 2 (two) hours as needed.   Past Month    REVIEW OF SYSTEMS: A ROS was performed and pertinent positives and negatives are included in the history. GENERAL: No fevers or chills. HEENT: No change in vision, no earache, sore throat or sinus congestion. NECK: No pain or stiffness. CARDIOVASCULAR: No chest pain or pressure. No palpitations.  PULMONARY: No shortness of breath, cough or wheeze. GASTROINTESTINAL: No abdominal pain, nausea, vomiting or diarrhea, melena or bright red blood per rectum. GENITOURINARY: No urinary frequency, urgency, hesitancy or dysuria. MUSCULOSKELETAL: No joint or muscle pain, no back pain, no recent trauma. DERMATOLOGIC: No rash, no itching, no lesions. ENDOCRINE: No polyuria, polydipsia, no heat or cold intolerance. No recent change in weight. HEMATOLOGICAL: No anemia or easy bruising or bleeding. NEUROLOGIC: No headache, seizures, numbness, tingling or weakness. PSYCHIATRIC: No depression, no loss of interest in normal activity or change in sleep pattern.     Blood pressure 113/76, pulse 81, temperature (!) 97.2 F (36.2 C), temperature source Oral, resp. rate 15, height 5' 1.75" (1.568 m), weight 84.1 kg, last menstrual period 01/16/2022, SpO2 98 %.  Physical Exam:   Results for orders placed or performed during the hospital encounter of 02/05/22 (from the past 24 hour(s))  Pregnancy, urine POC     Status: None   Collection Time: 02/05/22  9:19 AM  Result Value Ref Range   Preg Test, Ur NEGATIVE NEGATIVE   Pelvic US 01/02/22: T/V images.  Anteverted uterus normal in size and shape with no myometrial mass.  The uterus is measured at 9.01 x 6.66 x 4.92 cm.  Thickened and asymmetrical endometrium measuring 24.9 mm.  2 focal  echogenic areas corresponding to probable endometrial polyps are measured at 1 cm x 0.9 cm and 0.9 cm x 0.5 cm, best seen on 3D images.  Both ovaries are mobile, normal in size with normal follicular pattern.  Resolving corpus luteum on the right ovary.  Normal perfusion of both ovaries.  No adnexal mass.  No free fluid in the pelvis.     Assessment/Plan:  41 y.o. G3P3003    1. Metrorrhagia Had a menstrual period on 01/16/22 with blood clots, but lasted only 3 days.  Not bleeding currently.  No pelvic pain.  Pelvic US findings thoroughly reviewed.  Given the obvious endometrial lesions,  probable polyps x 2, the Sonohysto was not done.  Decision to proceed directly to HSC/Myosure Excisions/D+C.  Preop preparation, Surgery and Risks including the risk of uterine perforation and Post op precautions and expectations reviewed.  Patient voiced understanding and agreement.   2. Thickened endometrium As above.   3. Endometrial polyp At least 2 Polyps.   Other orders - SUMAtriptan (IMITREX) 25 MG tablet; Take by mouth.                          Patient was counseled as to the risk of surgery to include the following:   1. Infection (prohylactic antibiotics will be administered)   2. DVT/Pulmonary Embolism (prophylactic pneumo compression stockings will be used)   3.Trauma to internal organs requiring additional surgical procedure to repair any injury to internal organs requiring perhaps additional hospitalization days.   4.Hemmorhage requiring transfusion and blood products which carry risks such as anaphylactic reaction, hepatitis and AIDS   Patient had received literature information on the procedure scheduled and all her questions were answered and fully accepts all risk.    Audrey Black 02/05/2022, 9:41 AM

## 2022-02-05 NOTE — Anesthesia Preprocedure Evaluation (Signed)
Anesthesia Evaluation  Patient identified by MRN, date of birth, ID band Patient awake    Reviewed: Allergy & Precautions, NPO status , Patient's Chart, lab work & pertinent test results  History of Anesthesia Complications Negative for: history of anesthetic complications  Airway Mallampati: II  TM Distance: >3 FB Neck ROM: Full    Dental  (+) Dental Advisory Given, Teeth Intact   Pulmonary neg pulmonary ROS,    Pulmonary exam normal        Cardiovascular negative cardio ROS Normal cardiovascular exam     Neuro/Psych  Headaches,    GI/Hepatic Neg liver ROS, GERD  ,  Endo/Other  negative endocrine ROS  Renal/GU negative Renal ROS  negative genitourinary   Musculoskeletal negative musculoskeletal ROS (+)   Abdominal   Peds  Hematology negative hematology ROS (+)   Anesthesia Other Findings   Reproductive/Obstetrics thickened endometrium, endometrial polyp                            Anesthesia Physical Anesthesia Plan  ASA: 2  Anesthesia Plan: General   Post-op Pain Management: Tylenol PO (pre-op)* and Toradol IV (intra-op)*   Induction: Intravenous  PONV Risk Score and Plan: 3 and Ondansetron, Dexamethasone, Midazolam and Treatment may vary due to age or medical condition  Airway Management Planned: LMA  Additional Equipment: None  Intra-op Plan:   Post-operative Plan: Extubation in OR  Informed Consent: I have reviewed the patients History and Physical, chart, labs and discussed the procedure including the risks, benefits and alternatives for the proposed anesthesia with the patient or authorized representative who has indicated his/her understanding and acceptance.     Dental advisory given  Plan Discussed with:   Anesthesia Plan Comments:         Anesthesia Quick Evaluation

## 2022-02-05 NOTE — Transfer of Care (Signed)
Immediate Anesthesia Transfer of Care Note  Patient: Audrey Black  Procedure(s) Performed: DILATATION & CURETTAGE/HYSTEROSCOPY WITH MYOSURE (Uterus)  Patient Location: PACU  Anesthesia Type:General  Level of Consciousness: sedated  Airway & Oxygen Therapy: Patient Spontanous Breathing and Patient connected to face mask oxygen  Post-op Assessment: Report given to RN and Post -op Vital signs reviewed and stable  Post vital signs: Reviewed and stable  Last Vitals:  Vitals Value Taken Time  BP 120/80 02/05/22 1128  Temp    Pulse 70 02/05/22 1129  Resp 14 02/05/22 1129  SpO2 100 % 02/05/22 1129  Vitals shown include unvalidated device data.  Last Pain:  Vitals:   02/05/22 0936  TempSrc: Oral  PainSc: 5       Patients Stated Pain Goal: 7 (02/05/22 0936)  Complications: No notable events documented.

## 2022-02-05 NOTE — Anesthesia Procedure Notes (Signed)
Procedure Name: LMA Insertion Date/Time: 02/05/2022 10:51 AM  Performed by: Dairl Ponder, CRNAPre-anesthesia Checklist: Patient identified, Emergency Drugs available, Suction available and Patient being monitored Patient Re-evaluated:Patient Re-evaluated prior to induction Oxygen Delivery Method: Circle System Utilized Preoxygenation: Pre-oxygenation with 100% oxygen Induction Type: IV induction Ventilation: Mask ventilation without difficulty LMA: LMA inserted LMA Size: 3.0 Number of attempts: 1 Airway Equipment and Method: Bite block Placement Confirmation: positive ETCO2 Tube secured with: Tape Dental Injury: Teeth and Oropharynx as per pre-operative assessment

## 2022-02-06 ENCOUNTER — Encounter (HOSPITAL_BASED_OUTPATIENT_CLINIC_OR_DEPARTMENT_OTHER): Payer: Self-pay | Admitting: Obstetrics & Gynecology

## 2022-02-06 LAB — SURGICAL PATHOLOGY

## 2022-02-21 ENCOUNTER — Encounter: Payer: Self-pay | Admitting: Obstetrics & Gynecology

## 2022-02-21 ENCOUNTER — Ambulatory Visit (INDEPENDENT_AMBULATORY_CARE_PROVIDER_SITE_OTHER): Payer: Self-pay | Admitting: Obstetrics & Gynecology

## 2022-02-21 VITALS — BP 110/70 | HR 90

## 2022-02-21 DIAGNOSIS — Z30011 Encounter for initial prescription of contraceptive pills: Secondary | ICD-10-CM

## 2022-02-21 DIAGNOSIS — N840 Polyp of corpus uteri: Secondary | ICD-10-CM

## 2022-02-21 DIAGNOSIS — Z09 Encounter for follow-up examination after completed treatment for conditions other than malignant neoplasm: Secondary | ICD-10-CM

## 2022-02-21 MED ORDER — NORETHIN ACE-ETH ESTRAD-FE 1-20 MG-MCG(24) PO TABS: 1.0000 | ORAL_TABLET | Freq: Every day | ORAL | 4 refills | Status: DC

## 2022-02-21 NOTE — Progress Notes (Signed)
    Audrey Black [birth year] [id number]        41 y.o.  G3P3L3   RP: Postop HSC/Myosure/D+C on 02/05/22  HPI: Very good healing postop.  No abdominopelvic pain.  Mild vaginal spotting.  No fever.  Would like to start on BCPs.   OB History  Gravida Para Term Preterm AB Living  3 3 3     3   SAB IAB Ectopic Multiple Live Births          3    # Outcome Date GA Lbr Len/2nd Weight Sex Delivery Anes PTL Lv  3 Term           2 Term           1 Term             Past medical history,surgical history, problem list, medications, allergies, family history and social history were all reviewed and documented in the EPIC chart.   Directed ROS with pertinent positives and negatives documented in the history of present illness/assessment and plan.  Exam:  Vitals:   02/21/22 1428  BP: 110/70  Pulse: 90  SpO2: 98%   General appearance:  Normal  Abdomen: Normal  Gynecologic exam: Vulva normal.  Bimanual exam:  AV uterus, mobile, NT.  No adnexal mass, NT.  No vaginal bleeding or discharge.  Patho 02/05/22: FINAL MICROSCOPIC DIAGNOSIS:   A. ENDOMETRIUM, POLYP, CURETTAGE:  Benign proliferative phase endometrium  Focal changes suggestive of endometrial polyp  Negative for breakdown, atypia, hyperplasia and carcinoma    Assessment/Plan:  41 y.o. G3P3003   1. Status post gynecological surgery, follow-up exam Very good healing postop.  No abdominopelvic pain.  Mild vaginal spotting.  No fever.  Would like to start on BCPs.  2. Endometrial polyp  3. Encounter for initial prescription of contraceptive pills Would like to start on a contraceptive.  Decision to start on BCPs, no CI.  Risks discussed including an increase in BP and Blood clots.  Usage reviewed and prescription sent to pharmacy.  Other orders - Norethindrone Acetate-Ethinyl Estrad-FE (LOESTRIN 24 FE) 1-20 MG-MCG(24) tablet; Take 1 tablet by mouth daily.   20 MD, 2:36 PM 02/21/2022

## 2022-04-24 ENCOUNTER — Ambulatory Visit: Payer: Self-pay | Admitting: Obstetrics & Gynecology

## 2022-10-24 ENCOUNTER — Other Ambulatory Visit (HOSPITAL_COMMUNITY)
Admission: RE | Admit: 2022-10-24 | Discharge: 2022-10-24 | Disposition: A | Discharge status: Home / Self Care | Payer: Self-pay | Source: Ambulatory Visit | Attending: Obstetrics & Gynecology | Admitting: Obstetrics & Gynecology

## 2022-10-24 ENCOUNTER — Encounter: Payer: Self-pay | Admitting: Obstetrics & Gynecology

## 2022-10-24 ENCOUNTER — Ambulatory Visit (INDEPENDENT_AMBULATORY_CARE_PROVIDER_SITE_OTHER): Payer: Self-pay | Admitting: Obstetrics & Gynecology

## 2022-10-24 VITALS — BP 120/78 | HR 78 | Ht 61.75 in | Wt 182.0 lb

## 2022-10-24 DIAGNOSIS — Z01419 Encounter for gynecological examination (general) (routine) without abnormal findings: Secondary | ICD-10-CM

## 2022-10-24 DIAGNOSIS — Z3009 Encounter for other general counseling and advice on contraception: Secondary | ICD-10-CM

## 2022-10-24 NOTE — Progress Notes (Signed)
Michonne Betanzos [birth year] [id number]   History:    42 y.o. G3P3L3 Married.  Children 21, 36 and 2 yo.  RP:  Established patient presenting for annual gyn exam   HPI: Menses regular normal every month.  No BTB.  Had HSC/Myosure/D+C on 02/05/22, patho benign endometrium and polyp.  No pelvic pain.  No pain with IC.  Declines contraception, ok if conceives.  Breasts normal.  Will start Mammo this year at age 30.  BMI 33.56.  Fasting health labs here today.  Past medical history,surgical history, family history and social history were all reviewed and documented in the EPIC chart.  Gynecologic History Patient's last menstrual period was 10/18/2022 (exact date).  Obstetric History OB History  Gravida Para Term Preterm AB Living  '3 3 3     3  '$ SAB IAB Ectopic Multiple Live Births          3    # Outcome Date GA Lbr Len/2nd Weight Sex Delivery Anes PTL Lv  3 Term           2 Term           1 Term              ROS: A ROS was performed and pertinent positives and negatives are included in the history. GENERAL: No fevers or chills. HEENT: No change in vision, no earache, sore throat or sinus congestion. NECK: No pain or stiffness. CARDIOVASCULAR: No chest pain or pressure. No palpitations. PULMONARY: No shortness of breath, cough or wheeze. GASTROINTESTINAL: No abdominal pain, nausea, vomiting or diarrhea, melena or bright red blood per rectum. GENITOURINARY: No urinary frequency, urgency, hesitancy or dysuria. MUSCULOSKELETAL: No joint or muscle pain, no back pain, no recent trauma. DERMATOLOGIC: No rash, no itching, no lesions. ENDOCRINE: No polyuria, polydipsia, no heat or cold intolerance. No recent change in weight. HEMATOLOGICAL: No anemia or easy bruising or bleeding. NEUROLOGIC: No headache, seizures, numbness, tingling or weakness. PSYCHIATRIC: No depression, no loss of interest in normal activity or change in sleep pattern.     Exam:   BP 120/78   Pulse 78   Ht 5' 1.75" (1.568 m)    Wt 182 lb (82.6 kg)   LMP 10/18/2022 (Exact Date) Comment: sexually active-no contraception  SpO2 98%   BMI 33.56 kg/m   Body mass index is 33.56 kg/m.  General appearance : Well developed well nourished female. No acute distress HEENT: Eyes: no retinal hemorrhage or exudates,  Neck supple, trachea midline, no carotid bruits, no thyroidmegaly Lungs: Clear to auscultation, no rhonchi or wheezes, or rib retractions  Heart: Regular rate and rhythm, no murmurs or gallops Breast:Examined in sitting and supine position were symmetrical in appearance, no palpable masses or tenderness,  no skin retraction, no nipple inversion, no nipple discharge, no skin discoloration, no axillary or supraclavicular lymphadenopathy Abdomen: no palpable masses or tenderness, no rebound or guarding Extremities: no edema or skin discoloration or tenderness  Pelvic: Vulva: Normal             Vagina: No gross lesions or discharge  Cervix: No gross lesions or discharge.  Pap reflex done.  Uterus  AV, normal size, shape and consistency, non-tender and mobile  Adnexa  Without masses or tenderness  Anus: Normal   Assessment/Plan:  42 y.o. female for annual exam   1. Encounter for routine gynecological examination with Papanicolaou smear of cervix Menses regular normal every month.  No BTB.  Had HSC/Myosure/D+C on 02/05/22, patho benign  endometrium and polyp.  No pelvic pain.  No pain with IC.  Pap reflex today.  Declines contraception, ok if conceives.  Breasts normal.  Will start Mammo this year at age 19.  BMI 33.56.  Fasting health labs here today. - Cytology - PAP( [place]) - CBC - Comp Met ([facility]) - TSH - Lipid Profile  2. Encounter for other general counseling or advice on contraception  Declined contraception, ok if conceives.  Princess Bruins MD, 8:31 AM

## 2022-10-25 LAB — CBC
HCT: 35.4 % (ref 35.0–45.0)
Hemoglobin: 11.1 g/dL — ABNORMAL LOW (ref 11.7–15.5)
MCH: 27.3 pg (ref 27.0–33.0)
MCHC: 31.4 g/dL — ABNORMAL LOW (ref 32.0–36.0)
MCV: 87 fL (ref 80.0–100.0)
MPV: 10.7 fL (ref 7.5–12.5)
Platelets: 408 10*3/uL — ABNORMAL HIGH (ref 140–400)
RBC: 4.07 10*6/uL (ref 3.80–5.10)
RDW: 15.7 % — ABNORMAL HIGH (ref 11.0–15.0)
WBC: 6.4 10*3/uL (ref 3.8–10.8)

## 2022-10-25 LAB — COMPREHENSIVE METABOLIC PANEL
AG Ratio: 1.3 (calc) (ref 1.0–2.5)
ALT: 13 U/L (ref 6–29)
AST: 17 U/L (ref 10–30)
Albumin: 4.3 g/dL (ref 3.6–5.1)
Alkaline phosphatase (APISO): 100 U/L (ref 31–125)
BUN: 10 mg/dL (ref 7–25)
CO2: 25 mmol/L (ref 20–32)
Calcium: 9.3 mg/dL (ref 8.6–10.2)
Chloride: 105 mmol/L (ref 98–110)
Creat: 0.62 mg/dL (ref 0.50–0.97)
Globulin: 3.3 g/dL (calc) (ref 1.9–3.7)
Glucose, Bld: 90 mg/dL (ref 65–99)
Potassium: 4 mmol/L (ref 3.5–5.3)
Sodium: 138 mmol/L (ref 135–146)
Total Bilirubin: 0.2 mg/dL (ref 0.2–1.2)
Total Protein: 7.6 g/dL (ref 6.1–8.1)

## 2022-10-25 LAB — LIPID PANEL
Cholesterol: 194 mg/dL (ref <200)
HDL: 46 mg/dL — ABNORMAL LOW (ref >=50)
LDL Cholesterol (Calc): 121 mg/dL (calc) — ABNORMAL HIGH
Non-HDL Cholesterol (Calc): 148 mg/dL (calc) — ABNORMAL HIGH (ref <130)
Total CHOL/HDL Ratio: 4.2 (calc) (ref <5.0)
Triglycerides: 157 mg/dL — ABNORMAL HIGH (ref <150)

## 2022-10-25 LAB — TSH: TSH: 1.88 mIU/L

## 2022-10-28 LAB — CYTOLOGY - PAP: Diagnosis: NEGATIVE

## 2023-06-03 ENCOUNTER — Encounter (HOSPITAL_COMMUNITY): Payer: Self-pay

## 2023-06-03 ENCOUNTER — Ambulatory Visit (HOSPITAL_COMMUNITY)
Admission: EM | Admit: 2023-06-03 | Discharge: 2023-06-03 | Disposition: A | Discharge status: Home / Self Care | Payer: 59 | Attending: Internal Medicine | Admitting: Internal Medicine

## 2023-06-03 DIAGNOSIS — Z3202 Encounter for pregnancy test, result negative: Secondary | ICD-10-CM | POA: Diagnosis not present

## 2023-06-03 DIAGNOSIS — S39012A Strain of muscle, fascia and tendon of lower back, initial encounter: Secondary | ICD-10-CM | POA: Diagnosis not present

## 2023-06-03 DIAGNOSIS — W19XXXA Unspecified fall, initial encounter: Secondary | ICD-10-CM

## 2023-06-03 LAB — POCT URINE PREGNANCY: Preg Test, Ur: NEGATIVE

## 2023-06-03 LAB — POCT URINALYSIS DIP (MANUAL ENTRY)
Bilirubin, UA: NEGATIVE
Glucose, UA: NEGATIVE mg/dL
Ketones, POC UA: NEGATIVE mg/dL
Leukocytes, UA: NEGATIVE
Nitrite, UA: NEGATIVE
Protein Ur, POC: NEGATIVE mg/dL
Spec Grav, UA: 1.025 (ref 1.010–1.025)
Urobilinogen, UA: 0.2 U/dL
pH, UA: 6 (ref 5.0–8.0)

## 2023-06-03 MED ORDER — KETOROLAC TROMETHAMINE 30 MG/ML IJ SOLN
30.0000 mg | Freq: Once | INTRAMUSCULAR | Status: AC
  Administered 2023-06-03: 30 mg via INTRAMUSCULAR

## 2023-06-03 MED ORDER — ACETAMINOPHEN 325 MG PO TABS
975.0000 mg | ORAL_TABLET | Freq: Once | ORAL | Status: AC
  Administered 2023-06-03: 975 mg via ORAL

## 2023-06-03 MED ORDER — IBUPROFEN 600 MG PO TABS: 600.0000 mg | ORAL_TABLET | Freq: Four times a day (QID) | ORAL | 0 refills | Status: DC | PRN

## 2023-06-03 MED ORDER — ACETAMINOPHEN 325 MG PO TABS
ORAL_TABLET | ORAL | Status: AC
  Filled 2023-06-03: qty 3

## 2023-06-03 MED ORDER — BACLOFEN 10 MG PO TABS: 10.0000 mg | ORAL_TABLET | Freq: Three times a day (TID) | ORAL | 0 refills | Status: DC

## 2023-06-03 MED ORDER — KETOROLAC TROMETHAMINE 30 MG/ML IJ SOLN
INTRAMUSCULAR | Status: AC
  Filled 2023-06-03: qty 1

## 2023-06-03 NOTE — Discharge Instructions (Signed)
Your pain is likely due to a muscle strain which will improve on its own with time.   - You may take over the counter medicines to help with pain.  - If you were given a shot of pain medicine in the clinic today, you may start taking ibuprofen/other NSAIDs in 12-24 hours. - You may also take the prescribed muscle relaxer as directed as needed for muscle aches/spasm.  Do not take this medication and drive or drink alcohol as it can make you sleepy.  Mainly use this medicine at nighttime as needed. - Apply heat 20 minutes on then 20 minutes off and perform gentle range of motion exercises to the area of greatest pain to prevent muscle stiffness and provide further pain relief.   Red flag symptoms to watch out for are numbness/tingling to the legs, weakness, loss of bowel/bladder control, and/or worsening pain that does not respond well to medicines. Follow-up with your primary care provider or return to urgent care if your symptoms do not improve in the next 3 to 4 days with medications and interventions recommended today. If your symptoms are severe (red flag), please go to the emergency room.

## 2023-06-03 NOTE — ED Provider Notes (Addendum)
[facility]    CSN: [id number] Arrival date & time: 06/03/23  1911      History   Chief Complaint Chief Complaint  Patient presents with   Back Pain    HPI Audrey Black is a 42 y.o. female.   Patient presents to urgent care for evaluation of low back pain that started 8 days ago after a fall.  Patient was on the last step of a flight of stairs when she accidentally lost her footing and fell down landing onto her left arm/wrist.  She did not hit her head during the fall, did not lose consciousness, and denies dizziness, head pain, and vision changes.  She started having pain to the bilateral lower back the next day after the fall.  Pain is triggered by movement and improves with rest.  She did not land on her back and did not hit her buttocks during the fall.  No numbness or tingling, saddle paresthesia, changes to bowel or urinary habits, extremity weakness, radicular symptoms.  Denies pain to the left arm and the left wrist.  She has not attempted use of any over-the-counter medications help with symptoms/pain PTA.   Back Pain   Past Medical History:  Diagnosis Date   Abnormal Pap smear of cervix    Abnormal uterine bleeding    Hypertriglyceridemia    IDA (iron deficiency anemia)    Knee pain    Migraines    Obese    Patellofemoral stress syndrome    Plantar fasciitis    Varicose veins of lower extremity     Patient Active Problem List   Diagnosis Date Noted   BV (bacterial vaginosis) 09/18/2014   Lower abdominal pain 09/14/2014   Screening for STD (sexually transmitted disease) 09/14/2014   Cervical cancer screening 09/14/2014   GERD (gastroesophageal reflux disease) 07/25/2014   Family history of diabetes mellitus (DM) 07/25/2014    Past Surgical History:  Procedure Laterality Date   CHOLECYSTECTOMY  11/2013   DILATATION & CURETTAGE/HYSTEROSCOPY WITH MYOSURE N/A 02/05/2022   Procedure: DILATATION & CURETTAGE/HYSTEROSCOPY WITH MYOSURE;  Surgeon: Genia Del, MD;  Location: [facility];  Service: Gynecology;  Laterality: N/A;    OB History     Gravida  3   Para  3   Term  3   Preterm      AB      Living  3      SAB      IAB      Ectopic      Multiple      Live Births  3            Home Medications    Prior to Admission medications   Medication Sig Start Date End Date Taking? Authorizing Provider  baclofen (LIORESAL) 10 MG tablet Take 1 tablet (10 mg total) by mouth 3 (three) times daily. 06/03/23  Yes Carlisle Beers, FNP  ibuprofen (ADVIL) 600 MG tablet Take 1 tablet (600 mg total) by mouth every 6 (six) hours as needed. 06/03/23  Yes Carlisle Beers, FNP  ferrous sulfate 325 (65 FE) MG EC tablet Take 1 tablet by mouth daily. 12/24/21   [provider]  SUMAtriptan (IMITREX) 25 MG tablet Take by mouth every 2 (two) hours as needed. 01/03/22   [provider]    Family History Family History  Problem Relation Age of Onset   Diabetes Mother    Heart attack Mother    Diabetes Father  Heart attack Father    Diabetes Sister    Cancer Brother        stomach cancer   Diabetes Brother    Stomach cancer Brother    Diabetes Brother     Social History Social History   Tobacco Use   Smoking status: Never   Smokeless tobacco: Never  Vaping Use   Vaping status: Never Used  Substance Use Topics   Alcohol use: Not Currently    Comment: socially   Drug use: No     Allergies   Patient has no known allergies.   Review of Systems Review of Systems  Musculoskeletal:  Positive for back pain.  Per HPI   Physical Exam Triage Vital Signs ED Triage Vitals  Encounter Vitals Group     BP 06/03/23 2011 112/70     Systolic BP Percentile --      Diastolic BP Percentile --      Pulse Rate 06/03/23 2011 86     Resp 06/03/23 2011 18     Temp 06/03/23 2011 98.5 F (36.9 C)     Temp Source 06/03/23 2011 Oral     SpO2 06/03/23 2011 99 %     Weight --       Height --      Head Circumference --      Peak Flow --      Pain Score 06/03/23 2013 8     Pain Loc --      Pain Education --      Exclude from Growth Chart --    No data found.  Updated Vital Signs BP 112/70 (BP Location: Left Arm)   Pulse 86   Temp 98.5 F (36.9 C) (Oral)   Resp 18   LMP 04/19/2023 (Approximate)   SpO2 99%   Visual Acuity Right Eye Distance:   Left Eye Distance:   Bilateral Distance:    Right Eye Near:   Left Eye Near:    Bilateral Near:     Physical Exam Vitals and nursing note reviewed.  Constitutional:      Appearance: She is not ill-appearing or toxic-appearing.  HENT:     Head: Normocephalic and atraumatic.     Right Ear: Hearing and external ear normal.     Left Ear: Hearing and external ear normal.     Nose: Nose normal.     Mouth/Throat:     Lips: Pink.  Eyes:     General: Lids are normal. Vision grossly intact. Gaze aligned appropriately.     Extraocular Movements: Extraocular movements intact.     Conjunctiva/sclera: Conjunctivae normal.  Pulmonary:     Effort: Pulmonary effort is normal.  Musculoskeletal:     Cervical back: Normal and neck supple.     Thoracic back: Normal.     Lumbar back: Tenderness (TTP to the bilateral lumbar paraspinous muscles. No midline tenderness.) present. No swelling, edema, deformity, signs of trauma, lacerations, spasms or bony tenderness. Normal range of motion. Negative right straight leg raise test and negative left straight leg raise test. No scoliosis.  Skin:    General: Skin is warm and dry.     Capillary Refill: Capillary refill takes less than 2 seconds.     Findings: No rash.  Neurological:     General: No focal deficit present.     Mental Status: She is alert and oriented to person, place, and time. Mental status is at baseline.     Cranial Nerves: No dysarthria or  facial asymmetry.  Psychiatric:        Mood and Affect: Mood normal.        Speech: Speech normal.        Behavior: Behavior  normal.        Thought Content: Thought content normal.        Judgment: Judgment normal.      UC Treatments / Results  Labs (all labs ordered are listed, but only abnormal results are displayed) Labs Reviewed  POCT URINALYSIS DIP (MANUAL ENTRY) - Abnormal; Notable for the following components:      Result Value   Blood, UA trace-intact (*)    All other components within normal limits  POCT URINE PREGNANCY    EKG   Radiology No results found.  Procedures Procedures (including critical care time)  Medications Ordered in UC Medications  ketorolac (TORADOL) 30 MG/ML injection 30 mg (30 mg Intramuscular Given 06/03/23 2036)  acetaminophen (TYLENOL) tablet 975 mg (975 mg Oral Given 06/03/23 2036)    Initial Impression / Assessment and Plan / UC Course  I have reviewed the triage vital signs and the nursing notes.  Pertinent labs & imaging results that were available during my care of the patient were reviewed by me and considered in my medical decision making (see chart for details).   1. Strain of lumbar region, initial encounter; negative pregnancy test, fall Evaluation suggests pain is musculoskeletal in nature. Will manage this with rest, gentle ROM exercises, heat therapy, ibuprofen as needed for pain, and as needed use of muscle relaxer. Drowsiness precautions discussed regarding muscle relaxer use. Ketorolac 30mg  IM and tylenol 975mg  given in clinic (no NSAIDs for 24 hours).  Imaging: no indication for imaging based on stable musculoskeletal exam findings May follow-up with orthopedics as needed. Work/school excise note given. Urinalysis is unremarkable for signs of UTI.  Urine pregnancy negative.  Counseled patient on potential for adverse effects with medications prescribed/recommended today, strict ER and return-to-clinic precautions discussed, patient verbalized understanding.    Final Clinical Impressions(s) / UC Diagnoses   Final diagnoses:  Strain of  lumbar region, initial encounter  Negative pregnancy test  Fall, initial encounter     Discharge Instructions      Your pain is likely due to a muscle strain which will improve on its own with time.   - You may take over the counter medicines to help with pain.  - If you were given a shot of pain medicine in the clinic today, you may start taking ibuprofen/other NSAIDs in 12-24 hours. - You may also take the prescribed muscle relaxer as directed as needed for muscle aches/spasm.  Do not take this medication and drive or drink alcohol as it can make you sleepy.  Mainly use this medicine at nighttime as needed. - Apply heat 20 minutes on then 20 minutes off and perform gentle range of motion exercises to the area of greatest pain to prevent muscle stiffness and provide further pain relief.   Red flag symptoms to watch out for are numbness/tingling to the legs, weakness, loss of bowel/bladder control, and/or worsening pain that does not respond well to medicines. Follow-up with your primary care provider or return to urgent care if your symptoms do not improve in the next 3 to 4 days with medications and interventions recommended today. If your symptoms are severe (red flag), please go to the emergency room.       ED Prescriptions     Medication Sig Dispense Auth. Provider  baclofen (LIORESAL) 10 MG tablet Take 1 tablet (10 mg total) by mouth 3 (three) times daily. 30 each Carlisle Beers, FNP   ibuprofen (ADVIL) 600 MG tablet Take 1 tablet (600 mg total) by mouth every 6 (six) hours as needed. 30 tablet Carlisle Beers, FNP      PDMP not reviewed this encounter.   Carlisle Beers, FNP 06/03/23 2044    Carlisle Beers, FNP 06/03/23 2044

## 2023-06-03 NOTE — ED Triage Notes (Signed)
Pt presents to urgent care today reporting upper back pain that radiates to her ovaries for approximately one week. Pt states "the pain would come and go but today it remained constant/severe." Pt also reports she is unsure of pregnancy, "planned to take a test soon." Pt denies taking any medications for her pain.

## 2023-10-28 ENCOUNTER — Ambulatory Visit (INDEPENDENT_AMBULATORY_CARE_PROVIDER_SITE_OTHER): Payer: Self-pay | Admitting: Obstetrics and Gynecology

## 2023-10-28 ENCOUNTER — Encounter: Payer: Self-pay | Admitting: Obstetrics and Gynecology

## 2023-10-28 VITALS — BP 112/64 | HR 74 | Temp 98.0°F | Ht 61.25 in | Wt 186.0 lb

## 2023-10-28 DIAGNOSIS — N939 Abnormal uterine and vaginal bleeding, unspecified: Secondary | ICD-10-CM

## 2023-10-28 DIAGNOSIS — R7303 Prediabetes: Secondary | ICD-10-CM

## 2023-10-28 DIAGNOSIS — Z833 Family history of diabetes mellitus: Secondary | ICD-10-CM | POA: Diagnosis not present

## 2023-10-28 DIAGNOSIS — Z01419 Encounter for gynecological examination (general) (routine) without abnormal findings: Secondary | ICD-10-CM | POA: Diagnosis not present

## 2023-10-28 DIAGNOSIS — M25569 Pain in unspecified knee: Secondary | ICD-10-CM | POA: Insufficient documentation

## 2023-10-28 DIAGNOSIS — D649 Anemia, unspecified: Secondary | ICD-10-CM

## 2023-10-28 DIAGNOSIS — E7889 Other lipoprotein metabolism disorders: Secondary | ICD-10-CM

## 2023-10-28 DIAGNOSIS — E782 Mixed hyperlipidemia: Secondary | ICD-10-CM

## 2023-10-28 DIAGNOSIS — E559 Vitamin D deficiency, unspecified: Secondary | ICD-10-CM

## 2023-10-28 MED ORDER — NAPROXEN 500 MG PO TABS: 500.0000 mg | ORAL_TABLET | Freq: Two times a day (BID) | ORAL | 3 refills | Status: AC

## 2023-10-28 NOTE — Assessment & Plan Note (Signed)
Cervical cancer screening performed according to ASCCP guidelines. Encouraged annual mammogram screening Colonoscopy UTD Labs and immunizations with her primary Encouraged safe sexual practices as indicated Encouraged healthy lifestyle practices with diet and exercise For patients under 41yo, I recommend 1000mg  calcium daily and 600IU of vitamin D daily.

## 2023-10-28 NOTE — Patient Instructions (Signed)

## 2023-10-28 NOTE — Assessment & Plan Note (Deleted)
 Will complete hormonal work-up S/p diagnostic hysteroscopy and polypectomy, benign  2023 First line therapies reviewed, including hormonal therapy and tranexamic acid. Also discussed endometrial ablation, uterine artery embolization and hysterectomy. Patient is interested in NSAIDs. F/u Hgb

## 2023-10-28 NOTE — Assessment & Plan Note (Signed)
 Will complete hormonal work-up S/p diagnostic hysteroscopy and polypectomy, benign  2023 First line therapies reviewed, including hormonal therapy and tranexamic acid. Also discussed endometrial ablation, uterine artery embolization and hysterectomy. Patient is interested in NSAIDs. F/u Hgb

## 2023-10-28 NOTE — Progress Notes (Signed)
 43 y.o. G66P3003 female here for annual exam. Single. Due to language barrier, an interpreter was present during the history-taking and subsequent discussion (and for part of the physical exam) with this patient.  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  Patient's last menstrual period was 10/04/2023 (approximate). Period Duration (Days): 3-4 Period Pattern: Regular Menstrual Flow: Moderate Menstrual Control: Maxi pad Dysmenorrhea: None  Had HSC/Myosure/D+C on 02/05/22, patho benign endometrium and polyp.  Today patient notes heavy bleeding with some cycles.  During this period she is using 8-9 overnight pads during the day.  Abnormal bleeding: As noted above Pelvic discharge or pain: None Breast mass, nipple discharge or skin changes : None Birth control: None Last PAP:     Component Value Date/Time   DIAGPAP  10/24/2022 0844    - Negative for intraepithelial lesion or malignancy (NILM)   ADEQPAP  10/24/2022 0844    Satisfactory for evaluation; transformation zone component PRESENT.   Last mammogram: Never Last colonoscopy: 03/29/2020 Sexually active: Yes Exercising: No Smoker: No  GYN HISTORY: 2023 diagnostic hysteroscopy and polypectomy, benign  OB History  Gravida Para Term Preterm AB Living  3 3 3   3   SAB IAB Ectopic Multiple Live Births      3    # Outcome Date GA Lbr Len/2nd Weight Sex Type Anes PTL Lv  3 Term           2 Term           1 Term             Past Medical History:  Diagnosis Date   Abnormal Pap smear of cervix    Abnormal uterine bleeding    BV (bacterial vaginosis) 09/18/2014   Hypertriglyceridemia    IDA (iron deficiency anemia)    Knee pain    Migraines    Obese    Patellofemoral stress syndrome    Plantar fasciitis    Varicose veins of lower extremity     Past Surgical History:  Procedure Laterality Date   CHOLECYSTECTOMY  11/2013   DILATATION & CURETTAGE/HYSTEROSCOPY WITH MYOSURE N/A 02/05/2022   Procedure:  DILATATION & CURETTAGE/HYSTEROSCOPY WITH MYOSURE;  Surgeon: Genia Del, MD;  Location: [facility];  Service: Gynecology;  Laterality: N/A;    No current outpatient medications on file prior to visit.   No current facility-administered medications on file prior to visit.    Social History   Socioeconomic History   Marital status: Single    Spouse name: Not on file   Number of children: Not on file   Years of education: Not on file   Highest education level: Not on file  Occupational History   Not on file  Tobacco Use   Smoking status: Never   Smokeless tobacco: Never  Vaping Use   Vaping status: Never Used  Substance and Sexual Activity   Alcohol use: Not Currently    Comment: socially   Drug use: No   Sexual activity: Yes    Partners: Male    Birth control/protection: None  Other Topics Concern   Not on file  Social History Narrative   Lives with two children, sons, partner, and mother-in-law.    Social Drivers of Corporate investment banker Strain: Not on file  Food Insecurity: Not on file  Transportation Needs: Not on file  Physical Activity: Not on file  Stress: Not on file  Social Connections: Unknown (12/16/2021)   [facility]   Social Network    Social Network: Not on file  Intimate Partner Violence: Unknown (11/20/2021)   [facility], [facility]   HITS    Physically Hurt: Not on file    Insult or Talk Down To: Not on file    Threaten Physical Harm: Not on file    Scream or Curse: Not on file    Family History  Problem Relation Age of Onset   Diabetes Mother    Heart attack Mother    Diabetes Father    Heart attack Father    Diabetes Sister    Cancer Brother        stomach cancer   Diabetes Brother    Stomach cancer Brother    Diabetes Brother     No Known Allergies    PE Today's Vitals   10/28/23 0827  BP: 112/64  Pulse: 74  Temp: 98 F (36.7 C)  TempSrc: Oral   SpO2: 98%  Weight: 186 lb (84.4 kg)  Height: 5' 1.25" (1.556 m)   Body mass index is 34.86 kg/m.  Physical Exam Vitals reviewed. Exam conducted with a chaperone present.  Constitutional:      General: She is not in acute distress.    Appearance: Normal appearance.  HENT:     Head: Normocephalic and atraumatic.     Nose: Nose normal.  Eyes:     Extraocular Movements: Extraocular movements intact.     Conjunctiva/sclera: Conjunctivae normal.  Neck:     Thyroid: No thyroid mass, thyromegaly or thyroid tenderness.  Pulmonary:     Effort: Pulmonary effort is normal.  Chest:     Chest wall: No mass or tenderness.  Breasts:    Right: Normal. No swelling, mass, nipple discharge, skin change or tenderness.     Left: Normal. No swelling, mass, nipple discharge, skin change or tenderness.  Abdominal:     General: There is no distension.     Palpations: Abdomen is soft.     Tenderness: There is no abdominal tenderness.  Genitourinary:    General: Normal vulva.     Exam position: Lithotomy position.     Urethra: No prolapse.     Vagina: Normal. No vaginal discharge or bleeding.     Cervix: Normal. No lesion.     Uterus: Normal. Not enlarged and not tender.      Adnexa: Right adnexa normal and left adnexa normal.  Musculoskeletal:        General: Normal range of motion.     Cervical back: Normal range of motion.  Lymphadenopathy:     Upper Body:     Right upper body: No axillary adenopathy.     Left upper body: No axillary adenopathy.     Lower Body: No right inguinal adenopathy. No left inguinal adenopathy.  Skin:    General: Skin is warm and dry.  Neurological:     General: No focal deficit present.     Mental Status: She is alert.  Psychiatric:        Mood and Affect: Mood normal.        Behavior: Behavior normal.       Assessment and Plan:        Well woman exam with routine gynecological exam Assessment & Plan: Cervical cancer screening performed according to  ASCCP guidelines. Encouraged annual mammogram screening Colonoscopy UTD Labs and immunizations with her primary Encouraged safe sexual practices as indicated Encouraged healthy lifestyle practices with  diet and exercise For patients under 50yo, I recommend 1000mg  calcium daily and 600IU of vitamin D daily.   Orders: -     VITAMIN D 25 Hydroxy (Vit-D Deficiency, Fractures) -     Thyroid Panel With TSH -     Hemoglobin A1C w/out eAG -     CBC -     COMPLETE METABOLIC PANEL WITH GFR -     Lipid panel  Abnormal uterine bleeding (AUB) Assessment & Plan: Will complete hormonal work-up S/p diagnostic hysteroscopy and polypectomy, benign  2023 First line therapies reviewed, including hormonal therapy and tranexamic acid. Also discussed endometrial ablation, uterine artery embolization and hysterectomy. Patient is interested in NSAIDs. F/u Hgb  Orders: -     Naproxen; Take 1 tablet (500 mg total) by mouth 2 (two) times daily with a meal for 5 days. Start 2 days before your cycle.  Dispense: 30 tablet; Refill: 3  Family history of diabetes mellitus (DM) -     Hemoglobin A1C w/out eAG  Lipids abnormal -     Lipid panel  Anemia, unspecified type  Hx of anemia, if worsening, recommend repeat US/med f/u  Rosalyn Gess, MD

## 2023-10-29 ENCOUNTER — Other Ambulatory Visit: Payer: Self-pay | Admitting: Obstetrics and Gynecology

## 2023-10-29 DIAGNOSIS — Z1231 Encounter for screening mammogram for malignant neoplasm of breast: Secondary | ICD-10-CM

## 2023-10-29 LAB — CBC
HCT: 35 % (ref 35.0–45.0)
Hemoglobin: 10.8 g/dL — ABNORMAL LOW (ref 11.7–15.5)
MCH: 25.3 pg — ABNORMAL LOW (ref 27.0–33.0)
MCHC: 30.9 g/dL — ABNORMAL LOW (ref 32.0–36.0)
MCV: 82 fL (ref 80.0–100.0)
MPV: 11 fL (ref 7.5–12.5)
Platelets: 471 10*3/uL — ABNORMAL HIGH (ref 140–400)
RBC: 4.27 10*6/uL (ref 3.80–5.10)
RDW: 14.6 % (ref 11.0–15.0)
WBC: 7.4 10*3/uL (ref 3.8–10.8)

## 2023-10-29 LAB — COMPLETE METABOLIC PANEL WITH GFR
AG Ratio: 1.1 (calc) (ref 1.0–2.5)
ALT: 14 U/L (ref 6–29)
AST: 14 U/L (ref 10–30)
Albumin: 4.2 g/dL (ref 3.6–5.1)
Alkaline phosphatase (APISO): 119 U/L (ref 31–125)
BUN: 11 mg/dL (ref 7–25)
CO2: 21 mmol/L (ref 20–32)
Calcium: 9 mg/dL (ref 8.6–10.2)
Chloride: 105 mmol/L (ref 98–110)
Creat: 0.66 mg/dL (ref 0.50–0.99)
Globulin: 3.7 g/dL (ref 1.9–3.7)
Glucose, Bld: 89 mg/dL (ref 65–99)
Potassium: 4.2 mmol/L (ref 3.5–5.3)
Sodium: 135 mmol/L (ref 135–146)
Total Bilirubin: 0.2 mg/dL (ref 0.2–1.2)
Total Protein: 7.9 g/dL (ref 6.1–8.1)
eGFR: 114 mL/min/{1.73_m2} (ref >=60)

## 2023-10-29 LAB — HEMOGLOBIN A1C W/OUT EAG: Hgb A1c MFr Bld: 6 %{Hb} — ABNORMAL HIGH (ref <5.7)

## 2023-10-29 LAB — VITAMIN D 25 HYDROXY (VIT D DEFICIENCY, FRACTURES): Vit D, 25-Hydroxy: 23 ng/mL — ABNORMAL LOW (ref 30–100)

## 2023-10-29 LAB — THYROID PANEL WITH TSH
Free Thyroxine Index: 1.8 (ref 1.4–3.8)
T3 Uptake: 24 % (ref 22–35)
T4, Total: 7.5 ug/dL (ref 5.1–11.9)
TSH: 1.99 m[IU]/L

## 2023-10-29 LAB — LIPID PANEL
Cholesterol: 202 mg/dL — ABNORMAL HIGH (ref <200)
HDL: 42 mg/dL — ABNORMAL LOW (ref >=50)
LDL Cholesterol (Calc): 126 mg/dL — ABNORMAL HIGH
Non-HDL Cholesterol (Calc): 160 mg/dL — ABNORMAL HIGH (ref <130)
Total CHOL/HDL Ratio: 4.8 (calc) (ref <5.0)
Triglycerides: 207 mg/dL — ABNORMAL HIGH (ref <150)

## 2023-11-11 DIAGNOSIS — E559 Vitamin D deficiency, unspecified: Secondary | ICD-10-CM | POA: Insufficient documentation

## 2023-11-11 DIAGNOSIS — E782 Mixed hyperlipidemia: Secondary | ICD-10-CM | POA: Insufficient documentation

## 2023-11-11 DIAGNOSIS — R7303 Prediabetes: Secondary | ICD-10-CM | POA: Insufficient documentation

## 2023-11-11 MED ORDER — VITAMIN D 50 MCG (2000 UT) PO TABS: 2000.0000 [IU] | ORAL_TABLET | Freq: Every day | ORAL | 3 refills | Status: AC

## 2023-11-11 NOTE — Addendum Note (Signed)
 Addended by: Darrell Jewel V on: 11/11/2023 12:03 PM   Modules accepted: Orders

## 2023-11-18 ENCOUNTER — Ambulatory Visit
Admission: RE | Admit: 2023-11-18 | Discharge: 2023-11-18 | Disposition: A | Discharge status: Home / Self Care | Source: Ambulatory Visit | Attending: Obstetrics and Gynecology | Admitting: Obstetrics and Gynecology

## 2023-11-18 DIAGNOSIS — Z1231 Encounter for screening mammogram for malignant neoplasm of breast: Secondary | ICD-10-CM

## 2024-02-08 NOTE — Progress Notes (Incomplete)
   43 y.o. G62P3003 female here for ultrasound results. Single.  No LMP recorded.    She reports ***.  Birth control: None Last mammogram: 11/18/23 [facility] 1 Negative Sexually active: Yes    GYN HISTORY: ***  OB History  Gravida Para Term Preterm AB Living  3 3 3   3   SAB IAB Ectopic Multiple Live Births      3    # Outcome Date GA Lbr Len/2nd Weight Sex Type Anes PTL Lv  3 Term           2 Term           1 Term            Past Medical History:  Diagnosis Date   Abnormal Pap smear of cervix    Abnormal uterine bleeding    BV (bacterial vaginosis) 09/18/2014   Hypertriglyceridemia    IDA (iron deficiency anemia)    Knee pain    Migraines    Obese    Patellofemoral stress syndrome    Plantar fasciitis    Varicose veins of lower extremity    Past Surgical History:  Procedure Laterality Date   CHOLECYSTECTOMY  11/2013   DILATATION & CURETTAGE/HYSTEROSCOPY WITH MYOSURE N/A 02/05/2022   Procedure: DILATATION & CURETTAGE/HYSTEROSCOPY WITH MYOSURE;  Surgeon: Lavoie, Marie-Lyne, MD;  Location: [facility];  Service: Gynecology;  Laterality: N/A;   Current Outpatient Medications on File Prior to Visit  Medication Sig Dispense Refill   Cholecalciferol (VITAMIN D ) 50 MCG (2000 UT) tablet Take 1 tablet (2,000 Units total) by mouth daily. 90 tablet 3   No current facility-administered medications on file prior to visit.   No Known Allergies    PE There were no vitals filed for this visit. There is no height or weight on file to calculate BMI.  Physical Exam    Assessment and Plan:        There are no diagnoses linked to this encounter.  Clotilda FORBES Pa, CMA

## 2024-02-10 ENCOUNTER — Other Ambulatory Visit: Payer: Self-pay

## 2024-02-10 ENCOUNTER — Other Ambulatory Visit: Payer: Self-pay | Admitting: Obstetrics and Gynecology

## 2024-02-11 ENCOUNTER — Other Ambulatory Visit: Payer: Self-pay

## 2024-02-15 ENCOUNTER — Ambulatory Visit: Payer: Self-pay | Admitting: Obstetrics and Gynecology

## 2024-02-16 ENCOUNTER — Ambulatory Visit (INDEPENDENT_AMBULATORY_CARE_PROVIDER_SITE_OTHER): Payer: Self-pay

## 2024-02-16 DIAGNOSIS — N939 Abnormal uterine and vaginal bleeding, unspecified: Secondary | ICD-10-CM

## 2024-02-16 DIAGNOSIS — D649 Anemia, unspecified: Secondary | ICD-10-CM

## 2024-02-18 ENCOUNTER — Ambulatory Visit: Payer: Self-pay | Admitting: Obstetrics and Gynecology

## 2024-03-14 ENCOUNTER — Ambulatory Visit (INDEPENDENT_AMBULATORY_CARE_PROVIDER_SITE_OTHER): Payer: Self-pay | Admitting: Obstetrics and Gynecology

## 2024-03-14 ENCOUNTER — Encounter: Payer: Self-pay | Admitting: Obstetrics and Gynecology

## 2024-03-14 VITALS — BP 112/72 | HR 86 | Temp 98.2°F | Ht 62.25 in | Wt 190.0 lb

## 2024-03-14 DIAGNOSIS — N939 Abnormal uterine and vaginal bleeding, unspecified: Secondary | ICD-10-CM

## 2024-03-14 LAB — CBC
HCT: 32.7 % — ABNORMAL LOW (ref 35.0–45.0)
Hemoglobin: 10.6 g/dL — ABNORMAL LOW (ref 11.7–15.5)
MCH: 28.7 pg (ref 27.0–33.0)
MCHC: 32.4 g/dL (ref 32.0–36.0)
MCV: 88.6 fL (ref 80.0–100.0)
MPV: 10.6 fL (ref 7.5–12.5)
Platelets: 440 Thousand/uL — ABNORMAL HIGH (ref 140–400)
RBC: 3.69 Million/uL — ABNORMAL LOW (ref 3.80–5.10)
RDW: 12.4 % (ref 11.0–15.0)
WBC: 8.6 Thousand/uL (ref 3.8–10.8)

## 2024-03-14 NOTE — Assessment & Plan Note (Signed)
 Hormonal work-up normal, however anemia present S/p diagnostic hysteroscopy and polypectomy, benign  2023 TVUS with thickened endometrium, no definitive polyp First line therapies reviewed, including hormonal therapy and tranexamic acid. Also discussed endometrial ablation, uterine artery embolization and hysterectomy. Repeat Hgb today Plan for EMB +Mirena insert, pamphlet provided

## 2024-03-14 NOTE — Progress Notes (Signed)
 43 y.o. G7P3003 female with AUB and anemia here for TVUS  discussion. Single. Due to language barrier, an interpreter was present during the history-taking and subsequent discussion (and for part of the physical exam) with this patient.  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  Patient's last menstrual period was 03/02/2024 (approximate). Period Duration (Days): 4-5 Period Pattern: Regular Menstrual Flow: Moderate Menstrual Control: Maxi pad Dysmenorrhea: (!) Severe  Had HSC/Myosure/D+C on 02/05/22, patho benign endometrium and polyp.  At March 2025 appt, she noted heavy bleeding with some cycles.  During this period she is using 8-9 overnight pads during the day.  Started on naproxen  at last appt. Bleeding slightly improved. Cramping worse. 10/28/23 Hgb 10.8, Plt 471k  02/16/24 TVUS:   Uterus: 9.2 x 6.6 x 4.7 cm, anteverted uterus. Endometrial thickness: 17 mm.  Avascular. Left ovary: 3.7 x 2.6 x 2.7 cm.  23 mm follicle versus simple cyst present. Right ovary: 2.3 x 1.9 x 1.6 cm, normal-appearing. No free fluid.  Impression:  Possibly thickened endometrium.  Correlate with menstrual cycle.  GYN HISTORY: 2023 diagnostic hysteroscopy and polypectomy, benign  OB History  Gravida Para Term Preterm AB Living  3 3 3   3   SAB IAB Ectopic Multiple Live Births      3    # Outcome Date GA Lbr Len/2nd Weight Sex Type Anes PTL Lv  3 Term      Vag-Spont   LIV  2 Term      Vag-Spont   LIV  1 Term      Vag-Spont   LIV    Past Medical History:  Diagnosis Date   Abnormal Pap smear of cervix    Abnormal uterine bleeding    BV (bacterial vaginosis) 09/18/2014   Hypertriglyceridemia    IDA (iron deficiency anemia)    Knee pain    Migraines    Obese    Patellofemoral stress syndrome    Plantar fasciitis    Varicose veins of lower extremity     Past Surgical History:  Procedure Laterality Date   CHOLECYSTECTOMY  11/2013   DILATATION & CURETTAGE/HYSTEROSCOPY  WITH MYOSURE N/A 02/05/2022   Procedure: DILATATION & CURETTAGE/HYSTEROSCOPY WITH MYOSURE;  Surgeon: Lavoie, Marie-Lyne, MD;  Location: [facility];  Service: Gynecology;  Laterality: N/A;    Current Outpatient Medications on File Prior to Visit  Medication Sig Dispense Refill   Cholecalciferol (VITAMIN D ) 50 MCG (2000 UT) tablet Take 1 tablet (2,000 Units total) by mouth daily. 90 tablet 3   No current facility-administered medications on file prior to visit.   No Known Allergies  PE Today's Vitals   03/14/24 1605  BP: 112/72  Pulse: 86  Temp: 98.2 F (36.8 C)  TempSrc: Oral  SpO2: 98%  Weight: 190 lb (86.2 kg)  Height: 5' 2.25 (1.581 m)   Body mass index is 34.47 kg/m.  Physical Exam Vitals reviewed.  Constitutional:      General: She is not in acute distress.    Appearance: Normal appearance.  HENT:     Head: Normocephalic and atraumatic.     Nose: Nose normal.  Eyes:     Extraocular Movements: Extraocular movements intact.     Conjunctiva/sclera: Conjunctivae normal.  Pulmonary:     Effort: Pulmonary effort is normal.  Musculoskeletal:        General: Normal range of motion.     Cervical back: Normal range of motion.  Neurological:  General: No focal deficit present.     Mental Status: She is alert.  Psychiatric:        Mood and Affect: Mood normal.        Behavior: Behavior normal.      Assessment and Plan:        Abnormal uterine bleeding (AUB) Assessment & Plan: Hormonal work-up normal, however anemia present S/p diagnostic hysteroscopy and polypectomy, benign  2023 TVUS with thickened endometrium, no definitive polyp First line therapies reviewed, including hormonal therapy and tranexamic acid. Also discussed endometrial ablation, uterine artery embolization and hysterectomy. Repeat Hgb today Plan for EMB +Mirena insert, pamphlet provided  Orders: -     CBC -     Endometrial biopsy; Future -     IUD Insertion;  Future   Vera LULLA Pa, MD

## 2024-04-11 ENCOUNTER — Other Ambulatory Visit (HOSPITAL_COMMUNITY)
Admission: RE | Admit: 2024-04-11 | Discharge: 2024-04-11 | Disposition: A | Discharge status: Home / Self Care | Payer: Self-pay | Source: Ambulatory Visit | Attending: Obstetrics and Gynecology | Admitting: Obstetrics and Gynecology

## 2024-04-11 ENCOUNTER — Ambulatory Visit (INDEPENDENT_AMBULATORY_CARE_PROVIDER_SITE_OTHER): Payer: Self-pay | Admitting: Obstetrics and Gynecology

## 2024-04-11 ENCOUNTER — Encounter: Payer: Self-pay | Admitting: Obstetrics and Gynecology

## 2024-04-11 ENCOUNTER — Ambulatory Visit: Payer: Self-pay | Admitting: Obstetrics and Gynecology

## 2024-04-11 VITALS — BP 120/80 | HR 80 | Temp 98.1°F | Ht 62.25 in | Wt 190.0 lb

## 2024-04-11 DIAGNOSIS — N939 Abnormal uterine and vaginal bleeding, unspecified: Secondary | ICD-10-CM | POA: Insufficient documentation

## 2024-04-11 DIAGNOSIS — Z01812 Encounter for preprocedural laboratory examination: Secondary | ICD-10-CM

## 2024-04-11 LAB — PREGNANCY, URINE: Preg Test, Ur: NEGATIVE

## 2024-04-11 MED ORDER — NORETHINDRONE 0.35 MG PO TABS
1.0000 | ORAL_TABLET | Freq: Every day | ORAL | 11 refills | Status: DC
Start: 2024-04-11 — End: 2024-05-19

## 2024-04-11 NOTE — Assessment & Plan Note (Addendum)
 Hormonal work-up normal, however anemia present S/p diagnostic hysteroscopy and polypectomy, benign  2023 TVUS with thickened endometrium, no definitive polyp EMB uncomplicated Unable to afford Mirena Dicussed alternative hormonal therapies. Start POP given frequent migraine with blurry vision Discussed Mirena placement at the health department

## 2024-04-11 NOTE — Patient Instructions (Signed)
 It is common to have vaginal bleeding and cramping for up to 72 hours after your biopsy. Please call our office with heavy vaginal bleeding, severe abdominal pain or fever. Avoid intercourse, tampon use, douching and baths for 7 days to decrease the risk of infection.

## 2024-04-11 NOTE — Progress Notes (Signed)
 43 y.o. G64P3003 female with AUB and anemia here for EMB. Single. Due to language barrier, an interpreter was present during the history-taking, physical exam and subsequent discussion with this patient.  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  Patient's last menstrual period was 04/02/2024 (approximate).   Had HSC/Myosure/D+C on 02/05/22, patho benign endometrium and polyp.  At March 2025 appt, she noted heavy bleeding with some cycles.  During this period she is using 8-9 overnight pads during the day.  Started on naproxen  at last appt. Bleeding slightly improved. Cramping worse.  Today, she reports she is Unable to afford Mirena. Has a history of migraine with blurry vision.  10/28/23 Hgb 10.8, Plt 471k 03/14/24 Hgb 10.6   02/16/24 TVUS:   Uterus: 9.2 x 6.6 x 4.7 cm, anteverted uterus. Endometrial thickness: 17 mm.  Avascular. Left ovary: 3.7 x 2.6 x 2.7 cm.  23 mm follicle versus simple cyst present. Right ovary: 2.3 x 1.9 x 1.6 cm, normal-appearing. No free fluid.  Impression:  Possibly thickened endometrium.  Correlate with menstrual cycle.  GYN HISTORY: 2023 diagnostic hysteroscopy and polypectomy, benign  OB History  Gravida Para Term Preterm AB Living  3 3 3   3   SAB IAB Ectopic Multiple Live Births      3    # Outcome Date GA Lbr Len/2nd Weight Sex Type Anes PTL Lv  3 Term      Vag-Spont   LIV  2 Term      Vag-Spont   LIV  1 Term      Vag-Spont   LIV    Past Medical History:  Diagnosis Date   Abnormal Pap smear of cervix    Abnormal uterine bleeding    BV (bacterial vaginosis) 09/18/2014   Hypertriglyceridemia    IDA (iron deficiency anemia)    Knee pain    Migraines    Obese    Patellofemoral stress syndrome    Plantar fasciitis    Varicose veins of lower extremity     Past Surgical History:  Procedure Laterality Date   CHOLECYSTECTOMY  11/2013   DILATATION & CURETTAGE/HYSTEROSCOPY WITH MYOSURE N/A 02/05/2022   Procedure:  DILATATION & CURETTAGE/HYSTEROSCOPY WITH MYOSURE;  Surgeon: Lavoie, Marie-Lyne, MD;  Location: [facility];  Service: Gynecology;  Laterality: N/A;    Current Outpatient Medications on File Prior to Visit  Medication Sig Dispense Refill   Cholecalciferol (VITAMIN D ) 50 MCG (2000 UT) tablet Take 1 tablet (2,000 Units total) by mouth daily. 90 tablet 3   No current facility-administered medications on file prior to visit.   No Known Allergies  PE Today's Vitals   04/11/24 0832  BP: 120/80  Pulse: 80  Temp: 98.1 F (36.7 C)  TempSrc: Oral  SpO2: 99%  Weight: 190 lb (86.2 kg)  Height: 5' 2.25 (1.581 m)   Body mass index is 34.47 kg/m.  Physical Exam Vitals reviewed. Exam conducted with a chaperone present.  Constitutional:      General: She is not in acute distress.    Appearance: Normal appearance.  HENT:     Head: Normocephalic and atraumatic.     Nose: Nose normal.  Eyes:     Extraocular Movements: Extraocular movements intact.     Conjunctiva/sclera: Conjunctivae normal.  Pulmonary:     Effort: Pulmonary effort is normal.  Genitourinary:    General: Normal vulva.     Exam position: Lithotomy position.     Vagina: Normal. No vaginal  discharge.     Cervix: Normal. No cervical motion tenderness, discharge or lesion.     Uterus: Normal. Not enlarged and not tender.      Adnexa: Right adnexa normal and left adnexa normal.  Musculoskeletal:        General: Normal range of motion.     Cervical back: Normal range of motion.  Neurological:     General: No focal deficit present.     Mental Status: She is alert.  Psychiatric:        Mood and Affect: Mood normal.        Behavior: Behavior normal.     Procedure Consent was signed. Timeout was performed. Speculum inserted into the vagina, cervix visualized and was prepped with Betadine.  Cervical block was declined. A single-toothed tenaculum was placed on the anterior lip of the cervix to stabilize it.   The 3 mm pipelle was introduced into the endometrial cavity without difficulty to a depth of 9cm, suction initiated and a moderate amount of tissue was obtained over 1 pass and sent to pathology.  The instruments were removed from the patient's vagina.  Minimal bleeding from the cervix was noted.  The patient tolerated the procedure well.     Assessment and Plan:        Abnormal uterine bleeding (AUB) Assessment & Plan: Hormonal work-up normal, however anemia present S/p diagnostic hysteroscopy and polypectomy, benign  2023 TVUS with thickened endometrium, no definitive polyp EMB uncomplicated Unable to afford Mirena Dicussed alternative hormonal therapies. Start POP given frequent migraine with blurry vision  Orders: -     Surgical pathology -     Norethindrone ; Take 1 tablet (0.35 mg total) by mouth daily.  Dispense: 28 tablet; Refill: 11  Pre-procedure lab exam -     Pregnancy, urine  RTO for annual or PRN  Vera LULLA Pa, MD

## 2024-04-12 LAB — SURGICAL PATHOLOGY

## 2024-04-19 NOTE — Telephone Encounter (Signed)
 Spoke with patient using [employer] ID# [id number], advised per Dr. Dallie. Patient request to cancel [facility] and proceed with pre-op. [facility] cancelled, pre-op scheduled for 05/03/24 at 1330. Patient read back and confirmed.   Routing to provider for final review. Patient is agreeable to disposition. Will close encounter.

## 2024-05-03 ENCOUNTER — Encounter: Payer: Self-pay | Admitting: Obstetrics and Gynecology

## 2024-05-17 ENCOUNTER — Other Ambulatory Visit: Payer: Self-pay | Admitting: Obstetrics and Gynecology

## 2024-05-17 ENCOUNTER — Other Ambulatory Visit: Payer: Self-pay

## 2024-05-19 ENCOUNTER — Encounter: Payer: Self-pay | Admitting: Obstetrics and Gynecology

## 2024-05-19 ENCOUNTER — Ambulatory Visit (INDEPENDENT_AMBULATORY_CARE_PROVIDER_SITE_OTHER): Payer: Self-pay | Admitting: Obstetrics and Gynecology

## 2024-05-19 VITALS — BP 106/62 | HR 80 | Temp 98.2°F | Ht 62.25 in | Wt 191.0 lb

## 2024-05-19 DIAGNOSIS — N840 Polyp of corpus uteri: Secondary | ICD-10-CM

## 2024-05-19 DIAGNOSIS — N939 Abnormal uterine and vaginal bleeding, unspecified: Secondary | ICD-10-CM

## 2024-05-19 NOTE — Progress Notes (Signed)
 43 y.o. G10P3003 female with AUB, anemia, migraine with aura (blurry vision) here for surgical consult: dx hyst, D&C, polypectomy. Single. Due to language barrier, an interpreter was present during the history-taking, physical exam and subsequent discussion with this patient.  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  Patient's last menstrual period was 04/27/2024 (exact date).   Had HSC/Myosure/D+C on 02/05/22, patho benign endometrium and polyp.  At March 2025 appt, she noted heavy bleeding with some cycles, at times using 8-9 overnight pads during the day.  Started on naproxen  with bleeding slightly improved but cramping worse.  Today, she reports she is unable to afford Mirena. Started POP but made migraine worse. Periods remain crampy, still taking naproxen . No CP or SOB.  10/28/23 Hgb 10.8, Plt 471k 03/14/24 Hgb 10.6  02/16/24 TVUS:   Uterus: 9.2 x 6.6 x 4.7 cm, anteverted uterus. Endometrial thickness: 17 mm.  Avascular. Left ovary: 3.7 x 2.6 x 2.7 cm.  23 mm follicle versus simple cyst present. Right ovary: 2.3 x 1.9 x 1.6 cm, normal-appearing. No free fluid.  Impression:  Possibly thickened endometrium.  Correlate with menstrual cycle.  04/11/24 pathology: A. ENDOMETRIUM, BIOPSY:  - Polypoid fragments of proliferative endometrium.  - Negative for atypia/EIN and malignancy.   GYN HISTORY: 2023 diagnostic hysteroscopy and polypectomy, benign  OB History  Gravida Para Term Preterm AB Living  3 3 3   3   SAB IAB Ectopic Multiple Live Births      3    # Outcome Date GA Lbr Len/2nd Weight Sex Type Anes PTL Lv  3 Term      Vag-Spont   LIV  2 Term      Vag-Spont   LIV  1 Term      Vag-Spont   LIV    Past Medical History:  Diagnosis Date   Abnormal Pap smear of cervix    Abnormal uterine bleeding    BV (bacterial vaginosis) 09/18/2014   Hypertriglyceridemia    IDA (iron deficiency anemia)    Knee pain    Migraines    Obese    Patellofemoral  stress syndrome    Plantar fasciitis    Varicose veins of lower extremity     Past Surgical History:  Procedure Laterality Date   CHOLECYSTECTOMY  11/2013   DILATATION & CURETTAGE/HYSTEROSCOPY WITH MYOSURE N/A 02/05/2022   Procedure: DILATATION & CURETTAGE/HYSTEROSCOPY WITH MYOSURE;  Surgeon: Lavoie, Marie-Lyne, MD;  Location: [facility];  Service: Gynecology;  Laterality: N/A;    Current Outpatient Medications on File Prior to Visit  Medication Sig Dispense Refill   Cholecalciferol (VITAMIN D ) 50 MCG (2000 UT) tablet Take 1 tablet (2,000 Units total) by mouth daily. 90 tablet 3   norethindrone  (MICRONOR ) 0.35 MG tablet Take 1 tablet (0.35 mg total) by mouth daily. (Patient not taking: Reported on 05/19/2024) 28 tablet 11   No current facility-administered medications on file prior to visit.   No Known Allergies  PE Today's Vitals   05/19/24 1443  BP: 106/62  Pulse: 80  Temp: 98.2 F (36.8 C)  TempSrc: Oral  SpO2: 99%  Weight: 191 lb (86.6 kg)  Height: 5' 2.25 (1.581 m)   Body mass index is 34.65 kg/m.  Physical Exam Vitals reviewed.  Constitutional:      General: She is not in acute distress.    Appearance: Normal appearance.  HENT:     Head: Normocephalic and atraumatic.     Nose: Nose normal.  Eyes:     Extraocular Movements: Extraocular movements intact.     Conjunctiva/sclera: Conjunctivae normal.  Cardiovascular:     Rate and Rhythm: Normal rate and regular rhythm.     Heart sounds: No murmur heard.    No friction rub. No gallop.  Pulmonary:     Effort: Pulmonary effort is normal. No respiratory distress.     Breath sounds: Normal breath sounds. No stridor. No wheezing, rhonchi or rales.  Musculoskeletal:        General: Normal range of motion.     Cervical back: Normal range of motion.  Neurological:     General: No focal deficit present.     Mental Status: She is alert.  Psychiatric:        Mood and Affect: Mood normal.         Behavior: Behavior normal.      Assessment and Plan:        Abnormal uterine bleeding (AUB)  Endometrial polyp  Discussed hysteroscopy, dilation and curettage, polypectomy w/ and w/o Novasure ablation.   Discussed outpatient procedure. Reviewed that  recovery is usually 1-2 days. Risks including infections, bleeding, and damage to surrounding organs reviewed. Recommend NPO prior to midnight and reviewed medication to take on day of surgery. Dicussed use of NSAIDS as needed for pain postoperatively.  Preop checklist: Antibiotics: none DVT ppx: SCDs Postop visit: 1 week Additional clearance: none As patient is self pay and has had hysteroscopy within last 2 years, I offered preop on day of procedure To f/u in office with any additional questions  Patient would like to know cost of the procedure before determining how to proceed. Referral placed with note to scheduling regarding cost requests.  Vera LULLA Pa, MD

## 2024-05-19 NOTE — Patient Instructions (Signed)
 Preoperative instructions: Nothing to eat or drink after midnight, unless instructed differently regarding clear liquids by the anesthesia team at [facility] health. Do not take any medications on the day of surgery, except those listed below: NONE Please follow all other instructions as provided by our surgical scheduler at [facility] and the anesthesia team at [facility] health.  Postoperative instructions: Take ibuprofen  as prescribed and over-the-counter Tylenol  as needed. (DO NOT TAKE IBUPROFEN  IF YOU HAVE CONTRAINDICATIONS THAT MAY INCLUDE USE OF BLOOD THINNERS, HISTORY STOMACH SURGERY OR GASTRITIS, CHRONIC KIDNEY DISEASE, ALLERGY, PRIOR INSTRUCTION TO AVOID IBUPROFEN -LIKE MEDICATIONS.)

## 2024-07-04 ENCOUNTER — Encounter: Payer: Self-pay | Admitting: Obstetrics and Gynecology

## 2024-08-15 ENCOUNTER — Encounter: Payer: Self-pay | Admitting: Obstetrics and Gynecology

## 2024-08-25 ENCOUNTER — Ambulatory Visit: Admit: 2024-08-25 | Admitting: Obstetrics and Gynecology

## 2024-08-25 SURGERY — ABLATION, ENDOMETRIUM, HYSTEROSCOPIC
Anesthesia: Monitor Anesthesia Care
